# Patient Record
Sex: Female | Born: 1935 | Race: White | Hispanic: No | Marital: Married | Smoking: Former smoker
Health system: Southern US, Community
[De-identification: ages and names within clinical notes are randomized; demographics above are authoritative.]

## PROBLEM LIST (undated history)

## (undated) DIAGNOSIS — E079 Disorder of thyroid, unspecified: Secondary | ICD-10-CM

## (undated) DIAGNOSIS — Z8673 Personal history of transient ischemic attack (TIA), and cerebral infarction without residual deficits: Secondary | ICD-10-CM

## (undated) DIAGNOSIS — G309 Alzheimer's disease, unspecified: Secondary | ICD-10-CM

## (undated) DIAGNOSIS — F0280 Dementia in other diseases classified elsewhere without behavioral disturbance: Secondary | ICD-10-CM

## (undated) HISTORY — PX: EYE SURGERY: SHX253

---

## 2001-06-04 ENCOUNTER — Ambulatory Visit (HOSPITAL_COMMUNITY): Admission: RE | Admit: 2001-06-04 | Discharge: 2001-06-04 | Payer: Self-pay | Admitting: Gastroenterology

## 2001-06-19 ENCOUNTER — Ambulatory Visit (HOSPITAL_COMMUNITY): Admission: RE | Admit: 2001-06-19 | Discharge: 2001-06-19 | Payer: Self-pay | Admitting: Gastroenterology

## 2001-06-19 ENCOUNTER — Encounter (INDEPENDENT_AMBULATORY_CARE_PROVIDER_SITE_OTHER): Payer: Self-pay | Admitting: Specialist

## 2004-12-29 ENCOUNTER — Encounter: Admission: RE | Admit: 2004-12-29 | Discharge: 2004-12-29 | Payer: Self-pay | Admitting: Endocrinology

## 2008-01-21 ENCOUNTER — Encounter: Admission: RE | Admit: 2008-01-21 | Discharge: 2008-01-21 | Payer: Self-pay | Admitting: Endocrinology

## 2009-05-03 ENCOUNTER — Encounter: Admission: RE | Admit: 2009-05-03 | Discharge: 2009-05-03 | Payer: Self-pay | Admitting: Endocrinology

## 2009-12-29 ENCOUNTER — Encounter: Admission: RE | Admit: 2009-12-29 | Discharge: 2009-12-29 | Payer: Self-pay | Admitting: Endocrinology

## 2010-05-18 ENCOUNTER — Other Ambulatory Visit: Payer: Self-pay | Admitting: Endocrinology

## 2010-05-18 DIAGNOSIS — Z1231 Encounter for screening mammogram for malignant neoplasm of breast: Secondary | ICD-10-CM

## 2010-05-22 ENCOUNTER — Ambulatory Visit
Admission: RE | Admit: 2010-05-22 | Discharge: 2010-05-22 | Disposition: A | Discharge status: Home / Self Care | Payer: Medicare Other | Source: Ambulatory Visit | Attending: Endocrinology | Admitting: Endocrinology

## 2010-05-22 DIAGNOSIS — Z1231 Encounter for screening mammogram for malignant neoplasm of breast: Secondary | ICD-10-CM

## 2011-09-27 ENCOUNTER — Ambulatory Visit: Payer: Self-pay

## 2014-02-06 ENCOUNTER — Ambulatory Visit: Payer: Self-pay | Admitting: Internal Medicine

## 2014-02-23 ENCOUNTER — Encounter: Payer: Self-pay | Admitting: Internal Medicine

## 2014-03-09 ENCOUNTER — Encounter: Payer: Self-pay | Admitting: Internal Medicine

## 2014-04-09 ENCOUNTER — Encounter: Payer: Self-pay | Admitting: Internal Medicine

## 2014-05-10 ENCOUNTER — Encounter: Payer: Self-pay | Admitting: Internal Medicine

## 2014-06-08 ENCOUNTER — Encounter
Admit: 2014-06-08 | Disposition: A | Discharge status: Home / Self Care | Payer: Self-pay | Attending: Internal Medicine | Admitting: Internal Medicine

## 2015-02-28 ENCOUNTER — Encounter: Payer: Self-pay | Admitting: Emergency Medicine

## 2015-02-28 ENCOUNTER — Emergency Department: Payer: Medicare Other

## 2015-02-28 ENCOUNTER — Emergency Department
Admission: EM | Admit: 2015-02-28 | Discharge: 2015-02-28 | Disposition: A | Discharge status: Home / Self Care | Payer: Medicare Other | Attending: Emergency Medicine | Admitting: Emergency Medicine

## 2015-02-28 DIAGNOSIS — R0602 Shortness of breath: Secondary | ICD-10-CM | POA: Insufficient documentation

## 2015-02-28 DIAGNOSIS — W1789XA Other fall from one level to another, initial encounter: Secondary | ICD-10-CM | POA: Diagnosis not present

## 2015-02-28 DIAGNOSIS — S0081XA Abrasion of other part of head, initial encounter: Secondary | ICD-10-CM | POA: Insufficient documentation

## 2015-02-28 DIAGNOSIS — N390 Urinary tract infection, site not specified: Secondary | ICD-10-CM | POA: Insufficient documentation

## 2015-02-28 DIAGNOSIS — Y998 Other external cause status: Secondary | ICD-10-CM | POA: Insufficient documentation

## 2015-02-28 DIAGNOSIS — R410 Disorientation, unspecified: Secondary | ICD-10-CM | POA: Diagnosis not present

## 2015-02-28 DIAGNOSIS — Y9289 Other specified places as the place of occurrence of the external cause: Secondary | ICD-10-CM | POA: Diagnosis not present

## 2015-02-28 DIAGNOSIS — T148XXA Other injury of unspecified body region, initial encounter: Secondary | ICD-10-CM

## 2015-02-28 DIAGNOSIS — S0990XA Unspecified injury of head, initial encounter: Secondary | ICD-10-CM | POA: Diagnosis present

## 2015-02-28 DIAGNOSIS — Z87891 Personal history of nicotine dependence: Secondary | ICD-10-CM | POA: Diagnosis not present

## 2015-02-28 DIAGNOSIS — W19XXXA Unspecified fall, initial encounter: Secondary | ICD-10-CM

## 2015-02-28 DIAGNOSIS — R531 Weakness: Secondary | ICD-10-CM | POA: Diagnosis not present

## 2015-02-28 DIAGNOSIS — Y9389 Activity, other specified: Secondary | ICD-10-CM | POA: Diagnosis not present

## 2015-02-28 HISTORY — DX: Alzheimer's disease, unspecified: G30.9

## 2015-02-28 HISTORY — DX: Personal history of transient ischemic attack (TIA), and cerebral infarction without residual deficits: Z86.73

## 2015-02-28 HISTORY — DX: Disorder of thyroid, unspecified: E07.9

## 2015-02-28 HISTORY — DX: Dementia in other diseases classified elsewhere without behavioral disturbance: F02.80

## 2015-02-28 LAB — URINALYSIS COMPLETE WITH MICROSCOPIC (ARMC ONLY)
BILIRUBIN URINE: NEGATIVE
Glucose, UA: NEGATIVE mg/dL
KETONES UR: NEGATIVE mg/dL
NITRITE: POSITIVE — AB
PH: 5 (ref 5.0–8.0)
Protein, ur: NEGATIVE mg/dL
Specific Gravity, Urine: 1.021 (ref 1.005–1.030)
Squamous Epithelial / LPF: NONE SEEN

## 2015-02-28 LAB — COMPREHENSIVE METABOLIC PANEL
ALT: 17 U/L (ref 14–54)
AST: 20 U/L (ref 15–41)
Albumin: 4.5 g/dL (ref 3.5–5.0)
Alkaline Phosphatase: 76 U/L (ref 38–126)
Anion gap: 8 (ref 5–15)
BUN: 17 mg/dL (ref 6–20)
CHLORIDE: 99 mmol/L — AB (ref 101–111)
CO2: 24 mmol/L (ref 22–32)
CREATININE: 0.84 mg/dL (ref 0.44–1.00)
Calcium: 8.9 mg/dL (ref 8.9–10.3)
GFR calc Af Amer: >60 mL/min (ref >60)
Glucose, Bld: 115 mg/dL — ABNORMAL HIGH (ref 65–99)
POTASSIUM: 3.7 mmol/L (ref 3.5–5.1)
SODIUM: 131 mmol/L — AB (ref 135–145)
Total Bilirubin: 0.9 mg/dL (ref 0.3–1.2)
Total Protein: 8.7 g/dL — ABNORMAL HIGH (ref 6.5–8.1)

## 2015-02-28 LAB — DIFFERENTIAL
BASOS ABS: 0 10*3/uL (ref 0–0.1)
BASOS PCT: 1 %
EOS ABS: 0 10*3/uL (ref 0–0.7)
Eosinophils Relative: 0 %
Lymphocytes Relative: 16 %
Lymphs Abs: 0.9 10*3/uL — ABNORMAL LOW (ref 1.0–3.6)
Monocytes Absolute: 0.5 10*3/uL (ref 0.2–0.9)
Monocytes Relative: 8 %
NEUTROS ABS: 4.6 10*3/uL (ref 1.4–6.5)
NEUTROS PCT: 75 %

## 2015-02-28 LAB — CBC
HCT: 44.8 % (ref 35.0–47.0)
Hemoglobin: 15.3 g/dL (ref 12.0–16.0)
MCH: 30.6 pg (ref 26.0–34.0)
MCHC: 34 g/dL (ref 32.0–36.0)
MCV: 89.8 fL (ref 80.0–100.0)
PLATELETS: 177 10*3/uL (ref 150–440)
RBC: 4.99 MIL/uL (ref 3.80–5.20)
RDW: 13.6 % (ref 11.5–14.5)
WBC: 6.1 10*3/uL (ref 3.6–11.0)

## 2015-02-28 LAB — PROTIME-INR
INR: 1.05
PROTHROMBIN TIME: 13.9 s (ref 11.4–15.0)

## 2015-02-28 LAB — TROPONIN I: Troponin I: <0.03 ng/mL (ref <0.031)

## 2015-02-28 LAB — APTT: APTT: 26 s (ref 24–36)

## 2015-02-28 MED ORDER — CEPHALEXIN 500 MG PO CAPS: 500.0000 mg | ORAL_CAPSULE | Freq: Three times a day (TID) | ORAL | Status: AC

## 2015-02-28 MED ORDER — DEXTROSE 5 % IV SOLN
1.0000 g | INTRAVENOUS | Status: DC
  Administered 2015-02-28: 1 g via INTRAVENOUS
  Filled 2015-02-28: qty 10

## 2015-02-28 NOTE — ED Provider Notes (Addendum)
[facility] Emergency Department Provider Note  ____________________________________________  Time seen: Approximately 635 PM  I have reviewed the triage vital signs and the nursing notes.   HISTORY  Chief Complaint Fall; Weakness; and Shortness of Breath  history largely given by the husband as well as the daughter. The patient does not remember the details of the falls.   HPI Jocelyn Gilbert is a 80 y.o. female with a history of Alzheimer's dementia who is presenting today with altered mental status and 2 falls. The falls were both unwitnessed. The patient does not remember the circumstances. She does have a very small abrasion to her forehead. She is not complaining of any pain at this time. She is able to walk with assistance after the falls. The first fall was likely from standing at 2 AM last night. The second fall was off a bar stool this afternoon. The family said that they may have seen some bruising on the right thigh as well. They stated the patient has been increasingly confused over the past 1-2 days. She usually also does not need any assistance to walk.   Past Medical History  Diagnosis Date  . History of TIAs   . Alzheimer disease   . Thyroid disease     There are no active problems to display for this patient.   Past Surgical History  Procedure Laterality Date  . Eye surgery      No current outpatient prescriptions on file.  Allergies Review of patient's allergies indicates no known allergies.  No family history on file.  Social History Social History  Substance Use Topics  . Smoking status: Former Smoker    Types: Cigarettes  . Smokeless tobacco: None  . Alcohol Use: Yes     Comment: occas    Review of Systems Constitutional: No fever/chills Eyes: No visual changes. ENT: No sore throat. Cardiovascular: Denies chest pain. Respiratory: Denies shortness of breath. Gastrointestinal: No abdominal pain.  No nausea, no  vomiting.  No diarrhea.  No constipation. Genitourinary: Negative for dysuria. Musculoskeletal: Negative for back pain. Skin: Negative for rash. Neurological: Negative for headaches, focal weakness or numbness.  10-point ROS otherwise negative.  ____________________________________________   PHYSICAL EXAM:  VITAL SIGNS: ED Triage Vitals  Enc Vitals Group     BP 02/28/15 1711 120/86 mmHg     Pulse Rate 02/28/15 1714 88     Resp 02/28/15 1711 18     Temp 02/28/15 1711 98.1 F (36.7 C)     Temp Source 02/28/15 1711 Oral     SpO2 02/28/15 1714 98 %     Weight 02/28/15 1711 161 lb (73.029 kg)     Height 02/28/15 1711  (1.626 m)     Head Cir --      Peak Flow --      Pain Score --      Pain Loc --      Pain Edu? --      Excl. in GC? --     Constitutional: Alert and oriented to self only which is her baseline. Well appearing and in no acute distress. Eyes: Conjunctivae are normal. PERRL. EOMI. Head: Abrasion to the anterior forearm which is about 1 cm and superficial. No active bleeding. No induration or pus. Nose: No congestion/rhinnorhea. Mouth/Throat: Mucous membranes are moist.  Oropharynx non-erythematous. Neck: No stridor.   Cardiovascular: Normal rate, regular rhythm. Grossly normal heart sounds.  Good peripheral circulation. Respiratory: Normal respiratory effort.  No retractions. Lungs  CTAB. Gastrointestinal: Soft and nontender. No distention. No abdominal bruits. No CVA tenderness. Musculoskeletal: No lower extremity tenderness nor edema.  No joint effusions. No bruising to the bilateral lower extremity. There is no tenderness or crepitus to the chest. No bruising of the abdomen. Spine palpated and no deformity, step-off or tenderness anywhere. Pelvis is stable and nontender. No shortening or rotation of the lower extremities. Patient able to actively lift each lower limb with 5 out of 5 strength. Neurologic:  Normal speech and language. No gross focal neurologic  deficits are appreciated.  Skin:  Skin is warm, dry and intact. No rash noted. Psychiatric: Mood and affect are normal. Speech and behavior are normal.  ____________________________________________   LABS (all labs ordered are listed, but only abnormal results are displayed)  Labs Reviewed  DIFFERENTIAL - Abnormal; Notable for the following:    Lymphs Abs 0.9 (*)    All other components within normal limits  COMPREHENSIVE METABOLIC PANEL - Abnormal; Notable for the following:    Sodium 131 (*)    Chloride 99 (*)    Glucose, Bld 115 (*)    Total Protein 8.7 (*)    All other components within normal limits  URINALYSIS COMPLETEWITH MICROSCOPIC ([facility] ONLY) - Abnormal; Notable for the following:    Color, Urine YELLOW (*)    APPearance HAZY (*)    Hgb urine dipstick 2+ (*)    Nitrite POSITIVE (*)    Leukocytes, UA 1+ (*)    Bacteria, UA MANY (*)    All other components within normal limits  URINE CULTURE  PROTIME-INR  APTT  CBC  TROPONIN I   ____________________________________________  EKG  ED ECG REPORT I, Arelia LongestSchaevitz,  David M, the attending physician, personally viewed and interpreted this ECG.   Date: 02/28/2015  EKG Time: 1803  Rate: 88  Rhythm: normal sinus rhythm  Axis: Left axis deviation.  Intervals:none  ST&T Change: No ST segment elevation or depression. No abnormal T-wave inversion.  ____________________________________________  RADIOLOGY  No acute intracranial abnormality. ____________________________________________   PROCEDURES    ____________________________________________   INITIAL IMPRESSION / ASSESSMENT AND PLAN / ED COURSE  Pertinent labs & imaging results that were available during my care of the patient were reviewed by me and considered in my medical decision making (see chart for details).  ----------------------------------------- 8:57 PM on 02/28/2015 -----------------------------------------  Asian resting comfortably at  this time. Updated the family as well as the patient regarding the patient's positive urinalysis. I also discussed with her the family feels comfortable that they may be able to handle the patient home. I expressed my concern that she may continue to fall if she continues to be confused. I believe that the falls are likely from her confusion and some mild altered mental status from the urinary tract infection. The patient is afebrile with a normal white count this time. The family says that they will be able to care for home and watch her closely. I will give her first dose of antibiotics here in the emergency department through the IV and discharge her with by mouth antibiotics.family understand the plan and are willing to comply. ____________________________________________   FINAL CLINICAL IMPRESSION(S) / ED DIAGNOSES  Altered mental status. Urinary tract infection.    Myrna Blazeravid Matthew Schaevitz, MD 02/28/15 2059  Also discussed return precautions including any worsening confusion or fever. The family understands that she'll return for any worsening or concerning symptoms.  Myrna Blazeravid Matthew Schaevitz, MD 02/28/15 2101

## 2015-02-28 NOTE — ED Notes (Signed)
Pt here with husband and daughter with c/o fall around 0200 this am, husband states he is unsure of why she fell, pt has alz/dementia and is a poor historian/unable to follow most [facility] commands. Pt fell again this afternoon from a barstool. Bruising to forehead and small lac noted, no bleeding at this time. Husband states her urine smells strong. Pt appears in no distress at this time.

## 2015-02-28 NOTE — ED Notes (Signed)
Patient transported to X-ray 

## 2015-02-28 NOTE — ED Notes (Signed)
Pt waiting for med to finish.

## 2015-02-28 NOTE — ED Notes (Signed)
LKW 2000 02/27/15.

## 2015-03-02 LAB — URINE CULTURE

## 2016-08-07 ENCOUNTER — Emergency Department: Payer: Medicare Other

## 2016-08-07 ENCOUNTER — Encounter: Payer: Self-pay | Admitting: Emergency Medicine

## 2016-08-07 ENCOUNTER — Observation Stay
Admission: EM | Admit: 2016-08-07 | Discharge: 2016-08-08 | Disposition: A | Discharge status: Home / Self Care | Payer: Medicare Other | Attending: Internal Medicine | Admitting: Internal Medicine

## 2016-08-07 DIAGNOSIS — Z79899 Other long term (current) drug therapy: Secondary | ICD-10-CM | POA: Diagnosis not present

## 2016-08-07 DIAGNOSIS — G309 Alzheimer's disease, unspecified: Secondary | ICD-10-CM | POA: Diagnosis not present

## 2016-08-07 DIAGNOSIS — R29810 Facial weakness: Secondary | ICD-10-CM | POA: Diagnosis not present

## 2016-08-07 DIAGNOSIS — Z87891 Personal history of nicotine dependence: Secondary | ICD-10-CM | POA: Diagnosis not present

## 2016-08-07 DIAGNOSIS — I639 Cerebral infarction, unspecified: Secondary | ICD-10-CM

## 2016-08-07 DIAGNOSIS — F0280 Dementia in other diseases classified elsewhere without behavioral disturbance: Secondary | ICD-10-CM | POA: Insufficient documentation

## 2016-08-07 DIAGNOSIS — G459 Transient cerebral ischemic attack, unspecified: Secondary | ICD-10-CM | POA: Diagnosis present

## 2016-08-07 DIAGNOSIS — Z7982 Long term (current) use of aspirin: Secondary | ICD-10-CM | POA: Diagnosis not present

## 2016-08-07 DIAGNOSIS — R531 Weakness: Secondary | ICD-10-CM | POA: Insufficient documentation

## 2016-08-07 DIAGNOSIS — E785 Hyperlipidemia, unspecified: Secondary | ICD-10-CM | POA: Diagnosis not present

## 2016-08-07 DIAGNOSIS — R350 Frequency of micturition: Secondary | ICD-10-CM | POA: Insufficient documentation

## 2016-08-07 DIAGNOSIS — Z8673 Personal history of transient ischemic attack (TIA), and cerebral infarction without residual deficits: Secondary | ICD-10-CM | POA: Insufficient documentation

## 2016-08-07 DIAGNOSIS — E079 Disorder of thyroid, unspecified: Secondary | ICD-10-CM | POA: Diagnosis not present

## 2016-08-07 LAB — COMPREHENSIVE METABOLIC PANEL
ALT: 15 U/L (ref 14–54)
ANION GAP: 8 (ref 5–15)
AST: 23 U/L (ref 15–41)
Albumin: 4 g/dL (ref 3.5–5.0)
Alkaline Phosphatase: 77 U/L (ref 38–126)
BUN: 8 mg/dL (ref 6–20)
CHLORIDE: 105 mmol/L (ref 101–111)
CO2: 25 mmol/L (ref 22–32)
Calcium: 9.2 mg/dL (ref 8.9–10.3)
Creatinine, Ser: 0.67 mg/dL (ref 0.44–1.00)
GFR calc non Af Amer: >60 mL/min (ref >60)
Glucose, Bld: 97 mg/dL (ref 65–99)
POTASSIUM: 3.6 mmol/L (ref 3.5–5.1)
SODIUM: 138 mmol/L (ref 135–145)
Total Bilirubin: 1.2 mg/dL (ref 0.3–1.2)
Total Protein: 7.2 g/dL (ref 6.5–8.1)

## 2016-08-07 LAB — CBC WITH DIFFERENTIAL/PLATELET
Basophils Absolute: 0 10*3/uL (ref 0–0.1)
Basophils Relative: 0 %
EOS ABS: 0.2 10*3/uL (ref 0–0.7)
EOS PCT: 3 %
HCT: 40.3 % (ref 35.0–47.0)
Hemoglobin: 13.7 g/dL (ref 12.0–16.0)
LYMPHS ABS: 1.5 10*3/uL (ref 1.0–3.6)
Lymphocytes Relative: 21 %
MCH: 30.7 pg (ref 26.0–34.0)
MCHC: 33.9 g/dL (ref 32.0–36.0)
MCV: 90.5 fL (ref 80.0–100.0)
MONO ABS: 0.8 10*3/uL (ref 0.2–0.9)
Monocytes Relative: 11 %
Neutro Abs: 4.6 10*3/uL (ref 1.4–6.5)
Neutrophils Relative %: 65 %
PLATELETS: 183 10*3/uL (ref 150–440)
RBC: 4.45 MIL/uL (ref 3.80–5.20)
RDW: 13 % (ref 11.5–14.5)
WBC: 7 10*3/uL (ref 3.6–11.0)

## 2016-08-07 LAB — URINALYSIS, COMPLETE (UACMP) WITH MICROSCOPIC
BILIRUBIN URINE: NEGATIVE
Bacteria, UA: NONE SEEN
Glucose, UA: NEGATIVE mg/dL
HGB URINE DIPSTICK: NEGATIVE
Ketones, ur: NEGATIVE mg/dL
Leukocytes, UA: NEGATIVE
NITRITE: NEGATIVE
PROTEIN: NEGATIVE mg/dL
Specific Gravity, Urine: 1.017 (ref 1.005–1.030)
Squamous Epithelial / LPF: NONE SEEN
pH: 5 (ref 5.0–8.0)

## 2016-08-07 LAB — TROPONIN I

## 2016-08-07 LAB — LIPASE, BLOOD: LIPASE: 24 U/L (ref 11–51)

## 2016-08-07 LAB — TSH: TSH: 0.191 u[IU]/mL — AB (ref 0.350–4.500)

## 2016-08-07 MED ORDER — ACETAMINOPHEN 325 MG PO TABS
650.0000 mg | ORAL_TABLET | Freq: Four times a day (QID) | ORAL | Status: DC | PRN
  Administered 2016-08-07: 21:00:00 650 mg via ORAL
  Filled 2016-08-07: qty 2

## 2016-08-07 MED ORDER — SODIUM CHLORIDE 0.9% FLUSH
3.0000 mL | Freq: Two times a day (BID) | INTRAVENOUS | Status: DC
  Administered 2016-08-07 – 2016-08-08 (×2): 3 mL via INTRAVENOUS

## 2016-08-07 MED ORDER — ACETAMINOPHEN 650 MG RE SUPP: 650.0000 mg | Freq: Four times a day (QID) | RECTAL | Status: DC | PRN

## 2016-08-07 MED ORDER — POLYETHYLENE GLYCOL 3350 17 G PO PACK: 17.0000 g | PACK | Freq: Every day | ORAL | Status: DC | PRN

## 2016-08-07 MED ORDER — ONDANSETRON HCL 4 MG/2ML IJ SOLN: 4.0000 mg | Freq: Four times a day (QID) | INTRAMUSCULAR | Status: DC | PRN

## 2016-08-07 MED ORDER — LEVOTHYROXINE SODIUM 112 MCG PO TABS
137.0000 ug | ORAL_TABLET | Freq: Every day | ORAL | Status: DC
  Administered 2016-08-08: 137 ug via ORAL
  Filled 2016-08-07: qty 1

## 2016-08-07 MED ORDER — SODIUM CHLORIDE 0.9% FLUSH
3.0000 mL | Freq: Two times a day (BID) | INTRAVENOUS | Status: DC
  Administered 2016-08-07 – 2016-08-08 (×2): 3 mL via INTRAVENOUS

## 2016-08-07 MED ORDER — ASPIRIN EC 81 MG PO TBEC: 81.0000 mg | DELAYED_RELEASE_TABLET | Freq: Every day | ORAL | Status: DC

## 2016-08-07 MED ORDER — BRIMONIDINE TARTRATE 0.2 % OP SOLN
1.0000 [drp] | Freq: Two times a day (BID) | OPHTHALMIC | Status: DC
  Administered 2016-08-07: 1 [drp] via OPHTHALMIC
  Filled 2016-08-07: qty 5

## 2016-08-07 MED ORDER — LATANOPROST 0.005 % OP SOLN
1.0000 [drp] | Freq: Every day | OPHTHALMIC | Status: DC
  Administered 2016-08-07: 1 [drp] via OPHTHALMIC
  Filled 2016-08-07: qty 2.5

## 2016-08-07 MED ORDER — SODIUM CHLORIDE 0.9% FLUSH
3.0000 mL | INTRAVENOUS | Status: DC | PRN
Start: 2016-08-07 — End: 2016-08-08

## 2016-08-07 MED ORDER — ENOXAPARIN SODIUM 40 MG/0.4ML ~~LOC~~ SOLN
40.0000 mg | SUBCUTANEOUS | Status: DC
  Administered 2016-08-07: 21:00:00 40 mg via SUBCUTANEOUS
  Filled 2016-08-07: qty 0.4

## 2016-08-07 MED ORDER — SODIUM CHLORIDE 0.9 % IV SOLN: 250.0000 mL | INTRAVENOUS | Status: DC | PRN

## 2016-08-07 MED ORDER — TIMOLOL MALEATE 0.5 % OP SOLN
1.0000 [drp] | Freq: Two times a day (BID) | OPHTHALMIC | Status: DC
  Administered 2016-08-07: 21:00:00 1 [drp] via OPHTHALMIC
  Filled 2016-08-07: qty 5

## 2016-08-07 MED ORDER — MEMANTINE HCL ER 28 MG PO CP24
28.0000 mg | ORAL_CAPSULE | Freq: Every day | ORAL | Status: DC
  Administered 2016-08-08: 28 mg via ORAL
  Filled 2016-08-07: qty 1

## 2016-08-07 MED ORDER — ONDANSETRON HCL 4 MG PO TABS: 4.0000 mg | ORAL_TABLET | Freq: Four times a day (QID) | ORAL | Status: DC | PRN

## 2016-08-07 MED ORDER — BRIMONIDINE TARTRATE-TIMOLOL 0.2-0.5 % OP SOLN: 1.0000 [drp] | Freq: Two times a day (BID) | OPHTHALMIC | Status: DC

## 2016-08-07 MED ORDER — ROSUVASTATIN CALCIUM 5 MG PO TABS
10.0000 mg | ORAL_TABLET | Freq: Every day | ORAL | Status: DC
  Administered 2016-08-08: 12:00:00 10 mg via ORAL
  Filled 2016-08-07: qty 2

## 2016-08-07 NOTE — H&P (Signed)
[employer] - [place] [facility]   PATIENT NAME: Jocelyn Gilbert    MR#:  [id number]  DATE OF BIRTH:  1934/07/18  DATE OF ADMISSION:  08/07/2016  PRIMARY CARE PHYSICIAN: Lynnea Ferrier, MD   REQUESTING/REFERRING PHYSICIAN: Dr. Raynelle Chary  CHIEF COMPLAINT:   Chief Complaint  Patient presents with  . Urinary Frequency    HISTORY OF PRESENT ILLNESS:  Jocelyn Gilbert  is a 82 y.o. female with a known history of Hyperlipidemia, Alzheimer's, Graves' disease presents to the hospital brought in by the husband due to weakness. Also noticed a left facial droop which lasted an hour. Presently patient is back to baseline. No history of stroke. Patient was doing well till yesterday night. Later was noticed to be weak. Presently she is had her CT scan of the head which was normal. Being admitted for workup of TIA. She is pleasantly confused. History obtained from husband at bedside and ER staff.  PAST MEDICAL HISTORY:   Past Medical History:  Diagnosis Date  . Alzheimer disease   . History of TIAs   . Thyroid disease     PAST SURGICAL HISTORY:   Past Surgical History:  Procedure Laterality Date  . EYE SURGERY      SOCIAL HISTORY:   Social History  Substance Use Topics  . Smoking status: Former Smoker    Types: Cigarettes  . Smokeless tobacco: Never Used  . Alcohol use Yes     Comment: occas    FAMILY HISTORY:  History reviewed. No pertinent family history. Multiple TIAs in her father DRUG ALLERGIES:  No Known Allergies  REVIEW OF SYSTEMS:   Review of Systems  Unable to perform ROS: Dementia    MEDICATIONS AT HOME:   Prior to Admission medications   Medication Sig Start Date End Date Taking? Authorizing Provider  aspirin EC 81 MG tablet Take 81 mg by mouth daily.   Yes Historical Provider, MD  calcium carbonate (CALCIUM 600) 600 MG TABS tablet Take 1 tablet by mouth daily.   Yes Historical Provider, MD  COMBIGAN 0.2-0.5 % ophthalmic solution  Place 1 drop into both eyes 2 (two) times daily.  06/26/16  Yes Historical Provider, MD  Cyanocobalamin (B-12) 2500 MCG TABS Take 2,500 mcg by mouth daily.   Yes Historical Provider, MD  LACTOBACILLUS RHAMNOSUS, GG, PO Take 1 tablet by mouth daily.   Yes Historical Provider, MD  latanoprost (XALATAN) 0.005 % ophthalmic solution Place 1 drop into both eyes at bedtime. 07/13/16  Yes Historical Provider, MD  NAMENDA XR 28 MG CP24 24 hr capsule Take 28 mg by mouth daily. 06/19/16  Yes Historical Provider, MD  rivastigmine (EXELON) 9.5 mg/24hr Place 1 patch onto the skin daily. 06/26/16  Yes Historical Provider, MD  rosuvastatin (CRESTOR) 10 MG tablet Take 10 mg by mouth daily.   Yes Historical Provider, MD  SYNTHROID 137 MCG tablet Take 137 mcg by mouth daily. 06/27/16  Yes Historical Provider, MD     VITAL SIGNS:  Blood pressure 123/61, pulse 81, temperature 97.8 F (36.6 C), temperature source Oral, resp. rate 18, weight 73 kg (161 lb), SpO2 90 %.  PHYSICAL EXAMINATION:  Physical Exam  GENERAL:  82 y.o.-year-old patient lying in the bed with no acute distress.  EYES: Pupils equal, round, reactive to light and accommodation. No scleral icterus. Extraocular muscles intact.  HEENT: Head atraumatic, normocephalic. Oropharynx and nasopharynx clear. No oropharyngeal erythema, moist oral mucosa  NECK:  Supple, no jugular venous distention. No thyroid  enlargement, no tenderness.  LUNGS: Normal breath sounds bilaterally, no wheezing, rales, rhonchi. No use of accessory muscles of respiration.  CARDIOVASCULAR: S1, S2 normal. No murmurs, rubs, or gallops.  ABDOMEN: Soft, nontender, nondistended. Bowel sounds present. No organomegaly or mass.  EXTREMITIES: No pedal edema, cyanosis, or clubbing. + 2 pedal & radial pulses b/l.   NEUROLOGIC: Cranial nerves II through XII are intact. No focal Motor or sensory deficits appreciated b/l PSYCHIATRIC: The patient is alert and Awake. Pleasantly confused. SKIN: No  obvious rash, lesion, or ulcer.   LABORATORY PANEL:   CBC  Recent Labs Lab 08/07/16 1506  WBC 7.0  HGB 13.7  HCT 40.3  PLT 183   ------------------------------------------------------------------------------------------------------------------  Chemistries   Recent Labs Lab 08/07/16 1558  NA 138  K 3.6  CL 105  CO2 25  GLUCOSE 97  BUN 8  CREATININE 0.67  CALCIUM 9.2  AST 23  ALT 15  ALKPHOS 77  BILITOT 1.2   ------------------------------------------------------------------------------------------------------------------  Cardiac Enzymes  Recent Labs Lab 08/07/16 1558  TROPONINI <0.03   ------------------------------------------------------------------------------------------------------------------  RADIOLOGY:  Dg Chest 1 View  Result Date: 08/07/2016 CLINICAL DATA:  Altered mental status. Weakness. Headache. Possible urinary tract infection. EXAM: CHEST 1 VIEW COMPARISON:  02/28/2015 FINDINGS: Heart size is normal. Aortic atherosclerosis. The lungs are clear. The vascularity is normal. No effusions. No significant bone finding. IMPRESSION: No active disease. Electronically Signed   By: Paulina Fusi M.D.   On: 08/07/2016 15:34   Ct Head Wo Contrast  Result Date: 08/07/2016 CLINICAL DATA:  Headache and altered mental status. History of Alzheimer's dementia EXAM: CT HEAD WITHOUT CONTRAST TECHNIQUE: Contiguous axial images were obtained from the base of the skull through the vertex without intravenous contrast. COMPARISON:  February 28, 2015 FINDINGS: Brain: There is extensive generalized atrophy, stable. There is no intracranial mass, hemorrhage, extra-axial fluid collection, or midline shift. There is small vessel disease throughout the centra semiovale bilaterally. There is small vessel disease throughout the anterior limbs of each external capsule, stable. Small vessel disease in each lateral thalamus is present. There is no appreciable acute infarct. Vascular:  There is no hyperdense vessel. Mild increased attenuation and both middle cerebral arteries is stable compared to the prior study. There is calcification in each carotid siphon region. Skull: The bony calvarium appears intact. Sinuses/Orbits: There is diffuse opacification of most of the ethmoid air cells bilaterally. There is mucosal thickening in each frontal sinus inferiorly. There is mild mucosal thickening in each maxillary antrum. No air-fluid levels. There is rightward deviation of the nasal septum. Orbits appear symmetric bilaterally. Other: There is chronic inferior left mastoid air cell disease. Mastoids elsewhere clear. IMPRESSION: Extensive atrophy with supratentorial small vessel disease, stable. No intracranial mass, hemorrhage, or extra-axial fluid collection. No acute appearing infarct. There are foci of arteriovascular calcification. There is extensive paranasal sinus disease, most marked in the ethmoid air cell regions as well as chronic inferior left mastoid disease. Electronically Signed   By: Bretta Bang III M.D.   On: 08/07/2016 15:28     IMPRESSION AND PLAN:   * TIA -Check MRI of the brain, Carotid dopplers, Echo - Start aspirin and statin. - Lovenox for DVT prophylaxis. - PT/OT/Speech consult as needed per symptoms - Consult neurology.  * Dementia. Watch for inpatient delirium.  * Hyperlipidemia. On Crestor.  * DVT prophylaxis with Lovenox  All the records are reviewed and case discussed with ED provider. Management plans discussed with the patient, family and they are in  agreement.  CODE STATUS: FULL CODE  TOTAL TIME TAKING CARE OF THIS PATIENT: 40 minutes.   Milagros Loll R M.D on 08/07/2016 at 6:38 PM  Between 7am to 6pm - Pager - [phone]  After 6pm go to [email or web address] - password [email or web address] [facility]  [facility]  Office  [phone]  CC: Primary care physician; Lynnea Ferrier, MD  Note: This dictation was prepared with Dragon  dictation along with smaller phrase technology. Any transcriptional errors that result from this process are unintentional.

## 2016-08-07 NOTE — ED Provider Notes (Signed)
[facility] Emergency Department Provider Note  ____________________________________________   First MD Initiated Contact with Patient 08/07/16 1434     (approximate)  I have reviewed the triage vital signs and the nursing notes.   HISTORY  Chief Complaint Urinary Frequency   HPI Jocelyn Gilbert is a 82 y.o. female with a history of dementia, TIAs and thyroid disease was present to the emergency department today with altered mental status. She is accompanied by her husband who says the last time that she was acting normally was 9 PM last night before going to bed. He said that she woke up at about 2 in the morning and had an episode of urinary and bowel incontinence. She then went back to bed and then at 7 AM woke up. The husband says that at 7 AM that he tried to feed her breakfast and she only minimal amount and was unable to walk. He said there was also. Time for about an hour he noticed left-sided facial droop which is now resolved. He says that she is usually able to converse but is increasingly confused at this time and is unable to recognize him which is worse than her normal baseline. He is concerned mostly for UTI or stroke.Husband reported the patient was clinically headaches before. However at this time the patient is not complaining of pain to her head.   Past Medical History:  Diagnosis Date  . Alzheimer disease   . History of TIAs   . Thyroid disease     There are no active problems to display for this patient.   Past Surgical History:  Procedure Laterality Date  . EYE SURGERY      Prior to Admission medications   Not on File    Allergies Patient has no known allergies.  History reviewed. No pertinent family history.  Social History Social History  Substance Use Topics  . Smoking status: Former Smoker    Types: Cigarettes  . Smokeless tobacco: Never Used  . Alcohol use Yes     Comment: occas    Review of  Systems  Level V caveat secondary to altered mental status.   ____________________________________________   PHYSICAL EXAM:  VITAL SIGNS: ED Triage Vitals  Enc Vitals Group     BP 08/07/16 1423 (!) 124/57     Pulse Rate 08/07/16 1423 93     Resp 08/07/16 1423 18     Temp 08/07/16 1423 97.8 F (36.6 C)     Temp Source 08/07/16 1423 Oral     SpO2 08/07/16 1423 95 %     Weight 08/07/16 1425 161 lb (73 kg)     Height --      Head Circumference --      Peak Flow --      Pain Score --      Pain Loc --      Pain Edu? --      Excl. in GC? --     Constitutional: Alert. in no acute distress.Is unable to give her name. However, is answering yes or no questions to some questions about the history and where she is having pain. Eyes: Conjunctivae are normal. PERRL. EOMI. Head: Atraumatic. Nose: No congestion/rhinnorhea. Mouth/Throat: Mucous membranes are moist.  Oropharynx non-erythematous. Neck: No stridor.   Cardiovascular: Normal rate, regular rhythm. Grossly normal heart sounds.   Respiratory: Normal respiratory effort.  No retractions. Lungs CTAB. Gastrointestinal: Soft and nontender. No distention.  Musculoskeletal: No lower extremity tenderness nor edema.  No joint effusions. Neurologic:  Normal speech and language. No gross focal neurologic deficits are appreciated.  Skin:  Skin is warm, dry and intact. No rash noted. Psychiatric: Mood and affect are normal. Speech and behavior are normal.  NIH Stroke Scale   Person Administering Scale: Arelia Longest  Administer stroke scale items in the order listed. Record performance in each category after each subscale exam. Do not go back and change scores. Follow directions provided for each exam technique. Scores should reflect what the patient does, not what the clinician thinks the patient can do. The clinician should record answers while administering the exam and work quickly. Except where indicated, the patient should not  be coached (i.e., repeated requests to patient to make a special effort).   1a  Level of consciousness: 1  1b. LOC questions:  0=Performs both tasks correctly  1c. LOC commands: 0=Performs both tasks correctly  2.  Best Gaze: 0=normal  3.  Visual: 0=No visual loss  4. Facial Palsy: 0=Normal symmetric movement  5a.  Motor left arm: 0=No drift, limb holds 90 (or 45) degrees for full 10 seconds  5b.  Motor right arm: 0=No drift, limb holds 90 (or 45) degrees for full 10 seconds  6a. motor left leg: 0=No drift, limb holds 90 (or 45) degrees for full 10 seconds  6b  Motor right leg:  0=No drift, limb holds 90 (or 45) degrees for full 10 seconds  7. Limb Ataxia: 0=Absent  8.  Sensory: 0=Normal; no sensory loss  9. Best Language:  0=No aphasia, normal  10. Dysarthria: 0=Normal  11. Extinction and Inattention: 0=No abnormality  12. Distal motor function: 0=Normal   Total:   1   ____________________________________________   LABS (all labs ordered are listed, but only abnormal results are displayed)  Labs Reviewed  URINALYSIS, COMPLETE (UACMP) WITH MICROSCOPIC - Abnormal; Notable for the following:       Result Value   Color, Urine YELLOW (*)    APPearance CLEAR (*)    All other components within normal limits  TSH - Abnormal; Notable for the following:    TSH 0.191 (*)    All other components within normal limits  CBC WITH DIFFERENTIAL/PLATELET  COMPREHENSIVE METABOLIC PANEL  LIPASE, BLOOD  TROPONIN I   ____________________________________________  EKG  ED ECG REPORT I, Arelia Longest, the attending physician, personally viewed and interpreted this ECG.   Date: 08/07/2016  EKG Time: 1505  Rate: 75  Rhythm: normal sinus rhythm  Axis: Normal  Intervals:none  ST&T Change: No ST segment elevation or depression. No abnormal T-wave inversion.  ____________________________________________  RADIOLOGY  DG Chest 1 View (Final result)  Result time 08/07/16 15:34:30   Final result by Paulina Fusi, MD (08/07/16 15:34:30)           Narrative:   CLINICAL DATA: Altered mental status. Weakness. Headache. Possible urinary tract infection.  EXAM: CHEST 1 VIEW  COMPARISON: 02/28/2015  FINDINGS: Heart size is normal. Aortic atherosclerosis. The lungs are clear. The vascularity is normal. No effusions. No significant bone finding.  IMPRESSION: No active disease.   Electronically Signed By: Paulina Fusi M.D. On: 08/07/2016 15:34            CT Head Wo Contrast (Final result)  Result time 08/07/16 15:28:51  Final result by Bretta Bang III, MD (08/07/16 15:28:51)           Narrative:   CLINICAL DATA: Headache and altered mental status. History of Alzheimer's dementia  EXAM: CT HEAD WITHOUT CONTRAST  TECHNIQUE: Contiguous axial images were obtained from the base of the skull through the vertex without intravenous contrast.  COMPARISON: February 28, 2015  FINDINGS: Brain: There is extensive generalized atrophy, stable. There is no intracranial mass, hemorrhage, extra-axial fluid collection, or midline shift. There is small vessel disease throughout the centra semiovale bilaterally. There is small vessel disease throughout the anterior limbs of each external capsule, stable. Small vessel disease in each lateral thalamus is present. There is no appreciable acute infarct.  Vascular: There is no hyperdense vessel. Mild increased attenuation and both middle cerebral arteries is stable compared to the prior study. There is calcification in each carotid siphon region.  Skull: The bony calvarium appears intact.  Sinuses/Orbits: There is diffuse opacification of most of the ethmoid air cells bilaterally. There is mucosal thickening in each frontal sinus inferiorly. There is mild mucosal thickening in each maxillary antrum. No air-fluid levels. There is rightward deviation of the nasal septum. Orbits appear symmetric  bilaterally.  Other: There is chronic inferior left mastoid air cell disease. Mastoids elsewhere clear.  IMPRESSION: Extensive atrophy with supratentorial small vessel disease, stable. No intracranial mass, hemorrhage, or extra-axial fluid collection. No acute appearing infarct. There are foci of arteriovascular calcification. There is extensive paranasal sinus disease, most marked in the ethmoid air cell regions as well as chronic inferior left mastoid disease.   Electronically Signed By: Bretta Bang III M.D. On: 08/07/2016 15:28          ____________________________________________   PROCEDURES  Procedure(s) performed:   Procedures  Critical Care performed:   ____________________________________________   INITIAL IMPRESSION / ASSESSMENT AND PLAN / ED COURSE  Pertinent labs & imaging results that were available during my care of the patient were reviewed by me and considered in my medical decision making (see chart for details).  ----------------------------------------- 5:09 PM on 08/07/2016 -----------------------------------------  Patient without obvious acute finding on her lab workup. Concern for TIA/CVA. Continues to be neuro intact. No facial droop at this time. Patient will be admitted to the hospital. Squamous plan the patient as well as her husband who is the bedside. Signed out to Dr. Elpidio Anis. However, the patient does continue to be slightly confused compared to her baseline. Out of the window for TPA as the last time the patient was seen normal was 9 PM last night.      ____________________________________________   FINAL CLINICAL IMPRESSION(S) / ED DIAGNOSES  CVA    NEW MEDICATIONS STARTED DURING THIS VISIT:  New Prescriptions   No medications on file     Note:  This document was prepared using Dragon voice recognition software and may include unintentional dictation errors.    Myrna Blazer, MD 08/07/16 1710

## 2016-08-07 NOTE — ED Notes (Signed)
Pt given meal tray.

## 2016-08-07 NOTE — ED Triage Notes (Signed)
Pt to ed with c/o altered mental status, foul smelling urine, weakness and c/o headache.  Pt has hx of alzhiemers disease.

## 2016-08-07 NOTE — ED Notes (Signed)
Green top recollect sent 

## 2016-08-07 NOTE — ED Notes (Signed)
Patient transported to CT/XR ?

## 2016-08-08 ENCOUNTER — Observation Stay: Payer: Medicare Other

## 2016-08-08 ENCOUNTER — Observation Stay
Admit: 2016-08-08 | Discharge: 2016-08-08 | Disposition: A | Discharge status: Home / Self Care | Payer: Medicare Other | Attending: Internal Medicine | Admitting: Internal Medicine

## 2016-08-08 DIAGNOSIS — R29810 Facial weakness: Secondary | ICD-10-CM | POA: Diagnosis not present

## 2016-08-08 NOTE — Care Management Obs Status (Signed)
MEDICARE OBSERVATION STATUS NOTIFICATION   Patient Details  Name: Jocelyn Gilbert MRN: [id number] Date of Birth: 1934/07/04   Medicare Observation Status Notification Given:  Yes    Marily Memos, RN 08/08/2016, 4:22 PM

## 2016-08-08 NOTE — Evaluation (Signed)
Clinical/Bedside Swallow Evaluation Patient Details  Name: Jocelyn Gilbert MRN: [id number] Date of Birth: 11/28/1934  Today's Date: 08/08/2016 Time: SLP Start Time (ACUTE ONLY): 1230 SLP Stop Time (ACUTE ONLY): 1320 SLP Time Calculation (min) (ACUTE ONLY): 50 min  Past Medical History:  Past Medical History:  Diagnosis Date  . Alzheimer disease   . History of TIAs   . Thyroid disease    Past Surgical History:  Past Surgical History:  Procedure Laterality Date  . EYE SURGERY     HPI:  Pt is a 82 y.o. female with a known history of hyperlipidemia, Alzheimer's Dementia, Graves' disease, h/o TIAs who presents to the hospital brought in by the husband due to weakness. Also noticed a left facial droop which lasted an hour. Presently patient is back to baseline. Presently she is had her CT scan of the head which was normal. Being admitted for workup of TIA. She is pleasantly confused. History obtained from husband at bedside. Currently this pm, pt frequently laughing and talking w/ others in room w/ word finding deficits noted - pt does have baseline of Alzheimer's Dementia and the laughing may have been an attmept to cover up the word finding deficits. Noted pt had a min hacking, fairly non-productive cough during talking.    Assessment / Plan / Recommendation Clinical Impression  Pt appeared to adequately tolerate trials of thin liquids and puree/soft solids w/ no overt s/s of aspiration noted during/post trials; pt appears at reduced risk for aspiration when following general aspiration precautions. Pt does have a baseline of Alzheimer's Dementia and d/t the degree of Cognitive impact, pt would benefit from support strategies at mealtime which were discussed w/ family members to include reducing distractions and talking. As pt appears to exhibit no gross oropharyngeal phase dysphagia, no further skilled ST services indicated at this time. Of note, education was given to family members/pt on  ways to support language and communication during this stressful time for pt (out of her norm and structure); noted results of MRI this AM indicating no acute findings at this time - advanced chronic small vessel ischemic changes throughout the hemispheric white matter. Results of this eval discussed w/ NSG.  SLP Visit Diagnosis: Dysphagia, unspecified (R13.10)    Aspiration Risk   (reduced )    Diet Recommendation  Regular diet w/ Thin liquids; general aspiration precautions to include reducing distractions during mealtimes  Medication Administration: Whole meds with liquid (can always utilize applesauce for Pill swallowing)    Other  Recommendations Recommended Consults:  (n/a currently) Oral Care Recommendations: Oral care BID;Patient independent with oral care;Staff/trained caregiver to provide oral care   Follow up Recommendations None      Frequency and Duration            Prognosis Prognosis for Safe Diet Advancement: Good Barriers to Reach Goals: Cognitive deficits      Swallow Study   General Date of Onset: 08/07/16 HPI: Pt is a 82 y.o. female with a known history of hyperlipidemia, Alzheimer's Dementia, Graves' disease, h/o TIAs who presents to the hospital brought in by the husband due to weakness. Also noticed a left facial droop which lasted an hour. Presently patient is back to baseline. Presently she is had her CT scan of the head which was normal. Being admitted for workup of TIA. She is pleasantly confused. History obtained from husband at bedside. Currently this pm, pt frequently laughing and talking w/ others in room w/ word finding deficits noted - pt  does have baseline of Alzheimer's Dementia and the laughing may have been an attmept to cover up the word finding deficits. Noted pt had a min hacking, fairly non-productive cough during talking.  Type of Study: Bedside Swallow Evaluation Previous Swallow Assessment: none indicated Diet Prior to this Study:  Regular;Thin liquids (husband monitors meals for pt at home per report) Temperature Spikes Noted: No (wbc 7.0) Respiratory Status: Room air History of Recent Intubation: No Behavior/Cognition: Alert;Cooperative;Pleasant mood;Confused;Requires cueing;Distractible Oral Cavity Assessment: Within Functional Limits Oral Care Completed by SLP: Recent completion by staff Oral Cavity - Dentition: Adequate natural dentition Vision: Functional for self-feeding Self-Feeding Abilities: Able to feed self;Needs assist;Needs set up (cues) Patient Positioning: Upright in bed Baseline Vocal Quality: Normal Volitional Cough: Strong Volitional Swallow: Able to elicit    Oral/Motor/Sensory Function Overall Oral Motor/Sensory Function: Within functional limits   Ice Chips Ice chips: Not tested Other Comments: getting ready to take her pills w/ NSG   Thin Liquid Thin Liquid: Within functional limits Presentation: Cup;Self Fed;Straw (~8 ozs total) Other Comments: education given on straw drinking; use of larger straws    Nectar Thick Nectar Thick Liquid: Not tested   Honey Thick Honey Thick Liquid: Not tested   Puree Puree: Within functional limits Presentation: Self Fed;Spoon (4 ozs)   Solid   GO   Solid: Within functional limits Presentation: Self Fed (2 trials)    Functional Assessment Tool Used: clinical judgement Functional Limitations: Swallowing Swallow Current Status (W0981): At least 1 percent but less than 20 percent impaired, limited or restricted Swallow Goal Status [phone]): At least 1 percent but less than 20 percent impaired, limited or restricted Swallow Discharge Status [phone]): At least 1 percent but less than 20 percent impaired, limited or restricted     Jerilynn Som, MS, CCC-SLP Jaylaa Gallion 08/08/2016,1:38 PM

## 2016-08-08 NOTE — Discharge Summary (Signed)
[facility] - [place] at [facility]   PATIENT NAME: Jocelyn Gilbert    MR#:  [id number]  DATE OF BIRTH:  [birth year]  DATE OF ADMISSION:  08/07/2016 ADMITTING PHYSICIAN: Milagros Loll, MD  DATE OF DISCHARGE: 08/08/16  PRIMARY CARE PHYSICIAN: Curtis Sites III, MD    ADMISSION DIAGNOSIS:  Facial droop [R29.810] CVA (cerebral vascular accident) (HCC) [I63.9] Cerebrovascular accident (CVA), unspecified mechanism (HCC) [I63.9]  DISCHARGE DIAGNOSIS:  TIA (suspected)  SECONDARY DIAGNOSIS:   Past Medical History:  Diagnosis Date  . Alzheimer disease   . History of TIAs   . Thyroid disease     HOSPITAL COURSE:  Jocelyn Gilbert  is a 82 y.o. female with a known history of Hyperlipidemia, Alzheimer's, Graves' disease presents to the hospital brought in by the husband due to weakness. Also noticed a left facial droop which lasted an hour. Presently patient is back to baseline. No history of stroke. Patient was doing well till yesterday night. Later was noticed to be weak   *TIA -MRI of the brain negative , Carotid dopplers no stenosis, Echo pending  - cont aspirin and statin. - Lovenox for DVT prophylaxis. - Spokewith husband and daughter at length. Husband and patient is back at her baseline. She has significant history of Alzheimer's dementia was not able to give me any history of review of systems earlier.or inpatient delirium. -PT will see patient prior to discharge.  * Hyperlipidemia. On Crestor.  * DVT prophylaxis with Lovenox  Were all hemodynamically stable. Discharge home once seen by PT Husband and daughter are agreeable CONSULTS OBTAINED:  Treatment Team:  Mick Sell, MD  DRUG ALLERGIES:  No Known Allergies  DISCHARGE MEDICATIONS:   Current Discharge Medication List    CONTINUE these medications which have NOT CHANGED   Details  aspirin EC 81 MG tablet Take 81 mg by mouth daily.    calcium carbonate (CALCIUM 600) 600 MG  TABS tablet Take 1 tablet by mouth daily.    COMBIGAN 0.2-0.5 % ophthalmic solution Place 1 drop into both eyes 2 (two) times daily.     Cyanocobalamin (B-12) 2500 MCG TABS Take 2,500 mcg by mouth daily.    LACTOBACILLUS RHAMNOSUS, GG, PO Take 1 tablet by mouth daily.    latanoprost (XALATAN) 0.005 % ophthalmic solution Place 1 drop into both eyes at bedtime.    NAMENDA XR 28 MG CP24 24 hr capsule Take 28 mg by mouth daily.    rivastigmine (EXELON) 9.5 mg/24hr Place 1 patch onto the skin daily.    rosuvastatin (CRESTOR) 10 MG tablet Take 10 mg by mouth daily.    SYNTHROID 137 MCG tablet Take 137 mcg by mouth daily.        If you experience worsening of your admission symptoms, develop shortness of breath, life threatening emergency, suicidal or homicidal thoughts you must seek medical attention immediately by calling 911 or calling your MD immediately  if symptoms less severe.  You Must read complete instructions/literature along with all the possible adverse reactions/side effects for all the Medicines you take and that have been prescribed to you. Take any new Medicines after you have completely understood and accept all the possible adverse reactions/side effects.   Please note  You were cared for by a hospitalist during your hospital stay. If you have any questions about your discharge medications or the care you received while you were in the hospital after you are discharged, you can call the unit and asked to speak  with the hospitalist on call if the hospitalist that took care of you is not available. Once you are discharged, your primary care physician will handle any further medical issues. Please note that NO REFILLS for any discharge medications will be authorized once you are discharged, as it is imperative that you return to your primary care physician (or establish a relationship with a primary care physician if you do not have one) for your aftercare needs so that they can  reassess your need for medications and monitor your lab values. Today   SUBJECTIVE   Patient has dementia issues with memory. She is very pleasant. She is unable to tell me why she is here. Somewhat confused. Focal weaknesses noted. Moves all her extremities well.   VITAL SIGNS:  Blood pressure 116/66, pulse 65, temperature 97.4 F (36.3 C), temperature source Oral, resp. rate 16, height  (1.651 m), weight 71.4 kg (157 lb 6.4 oz), SpO2 92 %.  I/O:   Intake/Output Summary (Last 24 hours) at 08/08/16 1442 Last data filed at 08/08/16 0800  Gross per 24 hour  Intake              120 ml  Output                0 ml  Net              120 ml    PHYSICAL EXAMINATION:  GENERAL:  82 y.o.-year-old patient lying in the bed with no acute distress.  EYES: Pupils equal, round, reactive to light and accommodation. No scleral icterus. Extraocular muscles intact.  HEENT: Head atraumatic, normocephalic. Oropharynx and nasopharynx clear.  NECK:  Supple, no jugular venous distention. No thyroid enlargement, no tenderness.  LUNGS: Normal breath sounds bilaterally, no wheezing, rales,rhonchi or crepitation. No use of accessory muscles of respiration.  CARDIOVASCULAR: S1, S2 normal. No murmurs, rubs, or gallops.  ABDOMEN: Soft, non-tender, non-distended. Bowel sounds present. No organomegaly or mass.  EXTREMITIES: No pedal edema, cyanosis, or clubbing.  NEUROLOGIC: Cranial nerves II through XII are intact. Muscle strength 5/5 in all extremities. Sensation intact. Gait not checked. Moves all extremities well PSYCHIATRIC: The patient is alert and oriented but pleasantly confused due to dementia SKIN: No obvious rash, lesion, or ulcer.   DATA REVIEW:   CBC   Recent Labs Lab 08/07/16 1506  WBC 7.0  HGB 13.7  HCT 40.3  PLT 183    Chemistries   Recent Labs Lab 08/07/16 1558  NA 138  K 3.6  CL 105  CO2 25  GLUCOSE 97  BUN 8  CREATININE 0.67  CALCIUM 9.2  AST 23  ALT 15  ALKPHOS 77   BILITOT 1.2    Microbiology Results   No results found for this or any previous visit (from the past 240 hour(s)).  RADIOLOGY:  Dg Chest 1 View  Result Date: 08/07/2016 CLINICAL DATA:  Altered mental status. Weakness. Headache. Possible urinary tract infection. EXAM: CHEST 1 VIEW COMPARISON:  02/28/2015 FINDINGS: Heart size is normal. Aortic atherosclerosis. The lungs are clear. The vascularity is normal. No effusions. No significant bone finding. IMPRESSION: No active disease. Electronically Signed   By: Paulina Fusi M.D.   On: 08/07/2016 15:34   Ct Head Wo Contrast  Result Date: 08/07/2016 CLINICAL DATA:  Headache and altered mental status. History of Alzheimer's dementia EXAM: CT HEAD WITHOUT CONTRAST TECHNIQUE: Contiguous axial images were obtained from the base of the skull through the vertex without intravenous contrast. COMPARISON:  February 28, 2015 FINDINGS: Brain: There is extensive generalized atrophy, stable. There is no intracranial mass, hemorrhage, extra-axial fluid collection, or midline shift. There is small vessel disease throughout the centra semiovale bilaterally. There is small vessel disease throughout the anterior limbs of each external capsule, stable. Small vessel disease in each lateral thalamus is present. There is no appreciable acute infarct. Vascular: There is no hyperdense vessel. Mild increased attenuation and both middle cerebral arteries is stable compared to the prior study. There is calcification in each carotid siphon region. Skull: The bony calvarium appears intact. Sinuses/Orbits: There is diffuse opacification of most of the ethmoid air cells bilaterally. There is mucosal thickening in each frontal sinus inferiorly. There is mild mucosal thickening in each maxillary antrum. No air-fluid levels. There is rightward deviation of the nasal septum. Orbits appear symmetric bilaterally. Other: There is chronic inferior left mastoid air cell disease. Mastoids elsewhere  clear. IMPRESSION: Extensive atrophy with supratentorial small vessel disease, stable. No intracranial mass, hemorrhage, or extra-axial fluid collection. No acute appearing infarct. There are foci of arteriovascular calcification. There is extensive paranasal sinus disease, most marked in the ethmoid air cell regions as well as chronic inferior left mastoid disease. Electronically Signed   By: Bretta Bang III M.D.   On: 08/07/2016 15:28   Mr Brain Wo Contrast  Result Date: 08/08/2016 CLINICAL DATA:  Headache and altered mental status. Alzheimer's dementia. EXAM: MRI HEAD WITHOUT CONTRAST TECHNIQUE: Multiplanar, multiecho pulse sequences of the brain and surrounding structures were obtained without intravenous contrast. COMPARISON:  Head CT 08/07/2016 FINDINGS: Brain: Diffusion imaging does not show any acute or subacute infarction. There is generalized brain atrophy. Mild small vessel change affects the pons. No focal cerebellar insult. Cerebral hemispheres show confluent abnormal T2 and FLAIR signal throughout the white matter consistent with chronic small vessel ischemic change. No cortical or large vessel territory infarction. No mass lesion, hemorrhage, hydrocephalus or extra-axial collection. No pituitary mass. Vascular: Major vessels at the base of the brain show flow. Skull and upper cervical spine: Negative Sinuses/Orbits: Mild paranasal sinus mucosal thickening. Other: None significant IMPRESSION: No acute or reversible process. Generalized brain atrophy. Advanced chronic small vessel ischemic changes throughout the hemispheric white matter. Electronically Signed   By: Paulina Fusi M.D.   On: 08/08/2016 12:33   US Carotid Bilateral  Result Date: 08/08/2016 CLINICAL DATA:  CVA. EXAM: BILATERAL CAROTID DUPLEX ULTRASOUND TECHNIQUE: Wallace Cullens scale imaging, color Doppler and duplex ultrasound were performed of bilateral carotid and vertebral arteries in the neck. COMPARISON:  CT 08/07/2016. FINDINGS:  Criteria: Quantification of carotid stenosis is based on velocity parameters that correlate the residual internal carotid diameter with NASCET-based stenosis levels, using the diameter of the distal internal carotid lumen as the denominator for stenosis measurement. The following velocity measurements were obtained: RIGHT ICA:  75/7 cm/sec CCA:  120/8 cm/sec SYSTOLIC ICA/CCA RATIO:  0.6 DIASTOLIC ICA/CCA RATIO:  0.8 ECA:  114 cm/sec LEFT ICA:  58/12 cm/sec CCA:  102/11 cm/sec SYSTOLIC ICA/CCA RATIO:  0.6 DIASTOLIC ICA/CCA RATIO:  1.2 ECA:  88 cm/sec RIGHT CAROTID ARTERY: Mild diffuse atherosclerotic vascular disease. No flow limiting stenosis. Degree of stenosis less 50%. RIGHT VERTEBRAL ARTERY: Bidirectional flow noted in the right vertebral artery. LEFT CAROTID ARTERY: Mild diffuse left carotid atherosclerotic vascular calcification. No flow limiting stenosis. Degree of stenosis less than 50%. LEFT VERTEBRAL ARTERY:  Patent with antegrade flow. IMPRESSION: 1. Mild diffuse bilateral carotid atherosclerotic vascular disease. No flow limiting stenosis. Degree of stenosis less than 50%  bilaterally. 2. Right vertebral artery is patent with bidirectional flow. Left vertebral artery patent with antegrade flow. Electronically Signed   By: Maisie Fus  Register   On: 08/08/2016 12:25     Management plans discussed with the patient, family and they are in agreement.  CODE STATUS:     Code Status Orders        Start     Ordered   08/07/16 1837  Full code  Continuous     08/07/16 1837    Code Status History    Date Active Date Inactive Code Status Order ID Comments User Context   This patient has a current code status but no historical code status.      TOTAL TIME TAKING CARE OF THIS PATIENT: *40* minutes.    Arliss Frisina M.D on 08/08/2016 at 2:42 PM  Between 7am to 6pm - Pager - [phone] After 6pm go to [email or web address] - Social research officer, government  [facility]  Office   [phone]  CC: Primary care physician; Lynnea Ferrier, MD

## 2016-08-08 NOTE — Progress Notes (Signed)
*  PRELIMINARY RESULTS* Echocardiogram 2D Echocardiogram has been performed.  Cristela Blue 08/08/2016, 3:23 PM

## 2016-08-08 NOTE — Progress Notes (Signed)
SLP Cancellation Note  Patient Details Name: Jocelyn Gilbert MRN: [id number] DOB: Jul 12, 1934   Cancelled treatment:       Reason Eval/Treat Not Completed: Patient at procedure or test/unavailable (attempted x2 this morning. Will return in PM)    Jerilynn Som, MS, CCC-SLP Watson,Katherine 08/08/2016, 11:31 AM

## 2016-08-08 NOTE — Progress Notes (Signed)
PT Cancellation Note  Patient Details Name: Jocelyn Gilbert MRN: [id number] DOB: 09/25/1934   Cancelled Treatment:    Reason Eval/Treat Not Completed: Patient at procedure or test/unavailable.  Pt currently not in room.  Will re-attempt PT eval at a later date/time.  Hendricks Limes, PT 08/08/16, 11:01 AM [phone]

## 2016-08-08 NOTE — Evaluation (Signed)
Physical Therapy Evaluation Patient Details Name: Jocelyn Gilbert MRN: [id number] DOB: 03/05/1935 Today's Date: 08/08/2016   History of Present Illness  Pt is a 82 y.o. female with a history of Alzhiemer's disease, TIAs and thyroid disease admitted for TIA presenting with altered mental status, weakness, headache and some L sided facial droop. All symptoms appear to have resolved at this time.   Clinical Impression  Pt found lying across the bed near the foot of the bed and incontinent of urine upon entry (RN notified). Assisted pt to reposition back to lying in bed with mod assist, briefs were applied, and bed linens were chaged with assistance from nursing. Pt required mod assist with bed mobility, CGA for transfers, and CGA to min assist with HHA for ambulation and stairs. Pt is slightly unsteady on her feet, but per pt's husband appears to be close to baseline level of function and cognition. Pt will benefit from skilled PT during admission to increase balance and functional mobility. Recommend 24/7 supervision/assist with no PT follow-up as pt is close to baseline level of function.      Follow Up Recommendations No PT follow up;Supervision/Assistance - 24 hour    Equipment Recommendations       Recommendations for Other Services       Precautions / Restrictions Precautions Precautions: Fall Restrictions Weight Bearing Restrictions: No      Mobility  Bed Mobility Overal bed mobility: Needs Assistance Bed Mobility: Supine to Sit     Supine to sit: Mod assist     General bed mobility comments: mod assist for trunk support and scooting to the EOB; pt gets confused during tasks and required multiple cues to assist with scooting  Transfers Overall transfer level: Needs assistance Equipment used: 1 person hand held assist Transfers: Sit to/from Stand Sit to Stand: Min guard            Ambulation/Gait Ambulation/Gait assistance: Min guard;Min assist Ambulation  Distance (Feet): 280 Feet Assistive device: 1 person hand held assist     Gait velocity interpretation: Below normal speed for age/gender General Gait Details: Pt requires min guard and min assist with LOB x3 with ambulation; pt ambulates with a shuffling gate with narrow base of support and staggers to the both sides  Stairs Stairs: Yes Stairs assistance: Min guard;Min assist Stair Management: One rail Right Number of Stairs: 5 General stair comments: Pt went up 4 stairs with CGA holding on to the R rail and HHA on L while counting as she went up each step; pt slightly tripped over 5th step requiring min assist to correct LOB (susspected d/t being use to only 4 steps at home)  Wheelchair Mobility    Modified Rankin (Stroke Patients Only)       Balance Overall balance assessment: Needs assistance Sitting-balance support: No upper extremity supported;Feet supported Sitting balance-Leahy Scale: Fair   Postural control: Posterior lean Standing balance support: Single extremity supported Standing balance-Leahy Scale: Fair Standing balance comment: Pt able to stand on one leg to have sock adjusted while holding onto the bed rail w/o LOB               High Level Balance Comments: Pt frequently turning head R/L to look at her surroundings; pt stopped to turn her head when given v/c's to look at something on the wall             Pertinent Vitals/Pain Pain Assessment: No/denies pain    Home Living Family/patient expects to be  discharged to:: Private residence Living Arrangements: Spouse/significant other Available Help at Discharge: Family;Available 24 hours/day Type of Home: House Home Access: Stairs to enter Entrance Stairs-Rails: Right Entrance Stairs-Number of Steps: 4 Home Layout: One level Home Equipment: Shower seat - built in;Hand held shower head Additional Comments: Pt lives with her husband who is with her 24/7; pt usually intermittently holds onto her  husband with ambulation; pt's daughter is a Engineer, civil (consulting) and provides support as needed.    Prior Function Level of Independence: Needs assistance      ADL's / Homemaking Assistance Needed: Pt's husband assists with ADLs and IADLs        Hand Dominance        Extremity/Trunk Assessment   Upper Extremity Assessment Upper Extremity Assessment: Overall WFL for tasks assessed    Lower Extremity Assessment Lower Extremity Assessment: Overall WFL for tasks assessed    Cervical / Trunk Assessment Cervical / Trunk Assessment: Normal  Communication   Communication: Expressive difficulties  Cognition Arousal/Alertness: Awake/alert Behavior During Therapy: Impulsive Overall Cognitive Status: History of cognitive impairments - at baseline                                 General Comments: Pt is slightly impulsive and pleasently confused, her husband reports that pt is a baseline      General Comments      Exercises     Assessment/Plan    PT Assessment Patient needs continued PT services  PT Problem List Decreased safety awareness;Decreased balance       PT Treatment Interventions DME instruction;Therapeutic activities;Gait training;Therapeutic exercise;Patient/family education;Stair training;Balance training;Functional mobility training    PT Goals (Current goals can be found in the Care Plan section)  Acute Rehab PT Goals Patient Stated Goal: to go home PT Goal Formulation: With patient/family Time For Goal Achievement: 08/22/16    Frequency Min 2X/week   Barriers to discharge        Co-evaluation               AM-PAC PT "6 Clicks" Daily Activity  Outcome Measure Difficulty turning over in bed (including adjusting bedclothes, sheets and blankets)?: None Difficulty moving from lying on back to sitting on the side of the bed? : Total Difficulty sitting down on and standing up from a chair with arms (e.g., wheelchair, bedside commode, etc,.)?:  None Help needed moving to and from a bed to chair (including a wheelchair)?: A Little Help needed walking in hospital room?: A Little Help needed climbing 3-5 steps with a railing? : A Little 6 Click Score: 18    End of Session Equipment Utilized During Treatment: Gait belt Activity Tolerance: Patient tolerated treatment well Patient left: in bed;with call bell/phone within reach;with bed alarm set;with family/visitor present Nurse Communication: Mobility status PT Visit Diagnosis: Unsteadiness on feet (R26.81);Other abnormalities of gait and mobility (R26.89)    Time: 1457-1530 PT Time Calculation (min) (ACUTE ONLY): 33 min   Charges:         PT G Codes:         Bayard More, SPT 08/08/2016, 3:56 PM

## 2016-08-08 NOTE — Progress Notes (Signed)
Discharge instructions reviewed with patient's spouse Channing Mutters. IV removed without complications or discomfort. Telemetry returned. Questions answered. Channing Mutters confirmed follow up appoint with Dr.Kline. Resident left unit via w/c.

## 2016-12-29 IMAGING — CR DG PELVIS 1-2V
1 series · 1 of 1 positions shown · non-contrast
Comparison: None.

CLINICAL DATA: Fall

EXAM:
PELVIS - 1-2 VIEW

[pelvis ap]
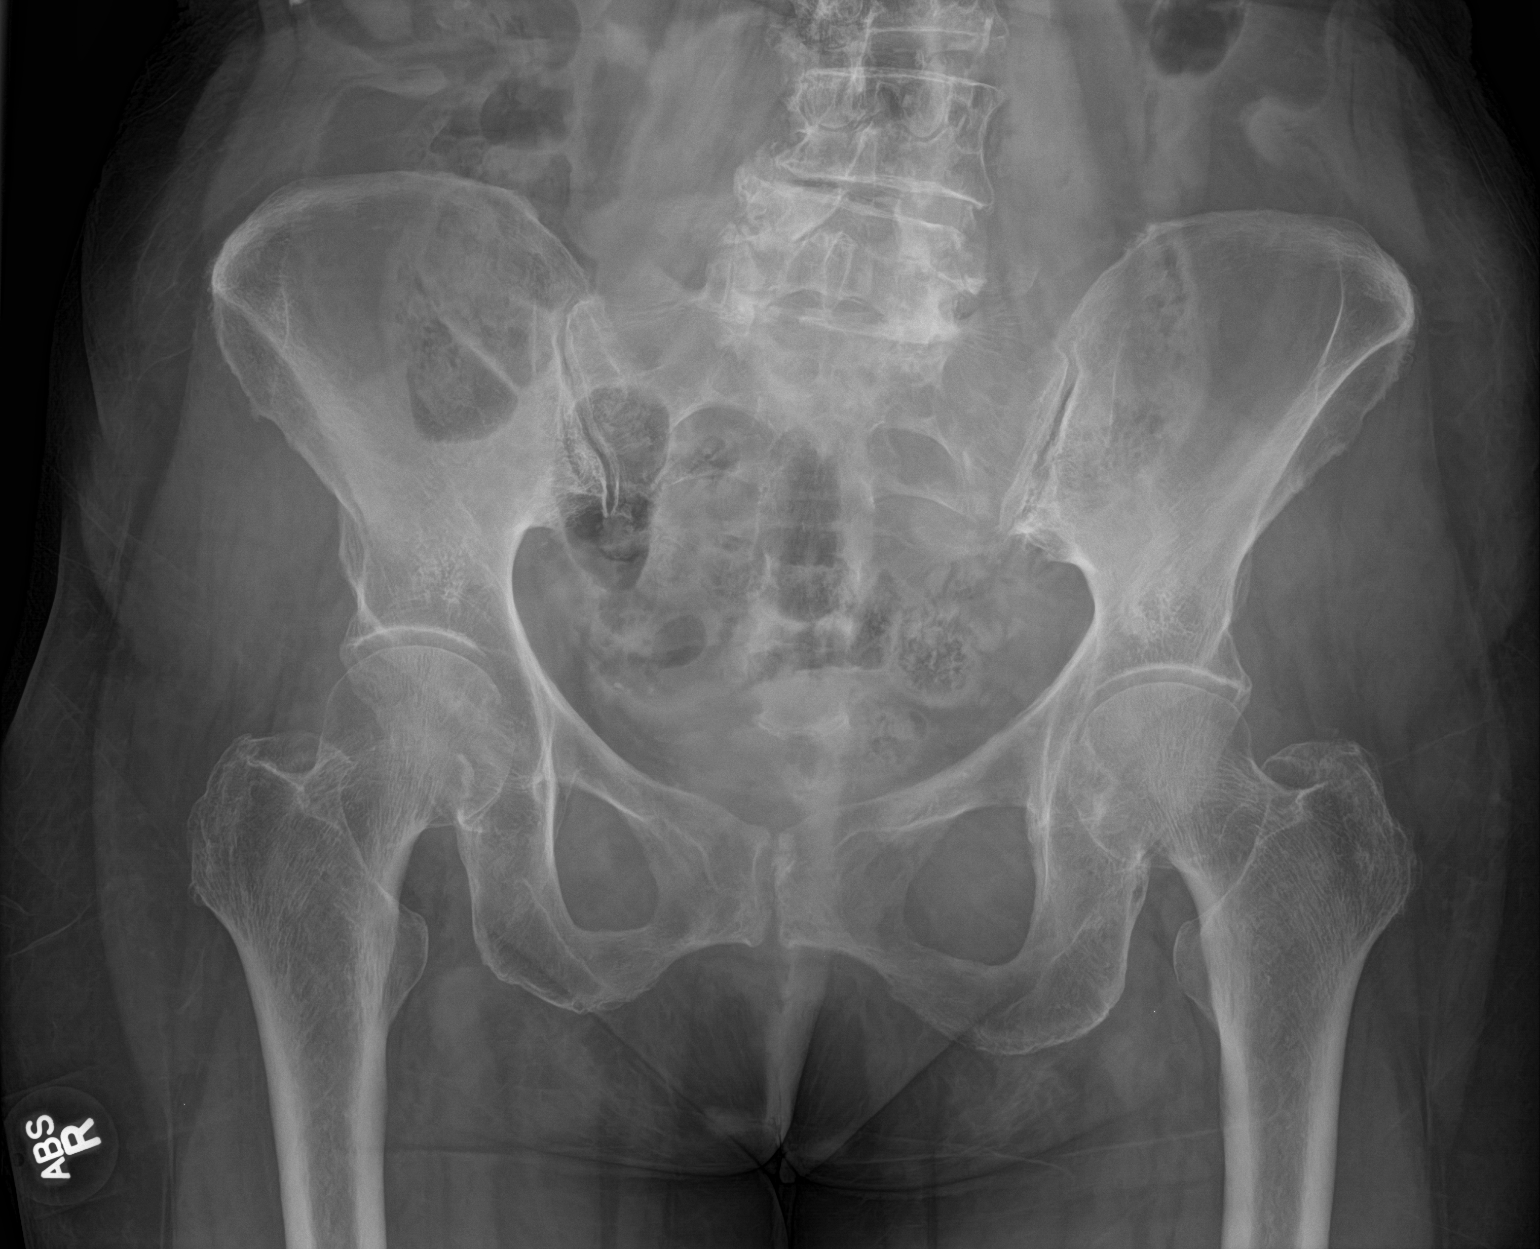

[1 of 1 positions shown; findings below may reference images not displayed]

FINDINGS: No acute fracture. No dislocation. Osteopenia. Degenerative changes
in the lower lumbar spine are prominent.
IMPRESSION: No acute bony pathology.

## 2017-10-16 ENCOUNTER — Encounter: Payer: Self-pay | Admitting: Emergency Medicine

## 2017-10-16 ENCOUNTER — Other Ambulatory Visit: Payer: Self-pay

## 2017-10-16 ENCOUNTER — Emergency Department
Admission: EM | Admit: 2017-10-16 | Discharge: 2017-10-16 | Disposition: A | Discharge status: Home / Self Care | Payer: Medicare Other | Attending: Emergency Medicine | Admitting: Emergency Medicine

## 2017-10-16 ENCOUNTER — Emergency Department: Payer: Medicare Other

## 2017-10-16 DIAGNOSIS — S0990XA Unspecified injury of head, initial encounter: Secondary | ICD-10-CM

## 2017-10-16 DIAGNOSIS — S0083XA Contusion of other part of head, initial encounter: Secondary | ICD-10-CM | POA: Insufficient documentation

## 2017-10-16 DIAGNOSIS — R51 Headache: Secondary | ICD-10-CM | POA: Insufficient documentation

## 2017-10-16 DIAGNOSIS — Y93E1 Activity, personal bathing and showering: Secondary | ICD-10-CM | POA: Insufficient documentation

## 2017-10-16 DIAGNOSIS — Z87891 Personal history of nicotine dependence: Secondary | ICD-10-CM | POA: Insufficient documentation

## 2017-10-16 DIAGNOSIS — Z7982 Long term (current) use of aspirin: Secondary | ICD-10-CM | POA: Insufficient documentation

## 2017-10-16 DIAGNOSIS — F0280 Dementia in other diseases classified elsewhere without behavioral disturbance: Secondary | ICD-10-CM | POA: Insufficient documentation

## 2017-10-16 DIAGNOSIS — W182XXA Fall in (into) shower or empty bathtub, initial encounter: Secondary | ICD-10-CM | POA: Diagnosis not present

## 2017-10-16 DIAGNOSIS — Z79899 Other long term (current) drug therapy: Secondary | ICD-10-CM | POA: Diagnosis not present

## 2017-10-16 DIAGNOSIS — Y92002 Bathroom of unspecified non-institutional (private) residence single-family (private) house as the place of occurrence of the external cause: Secondary | ICD-10-CM | POA: Diagnosis not present

## 2017-10-16 DIAGNOSIS — S0993XA Unspecified injury of face, initial encounter: Secondary | ICD-10-CM | POA: Diagnosis present

## 2017-10-16 DIAGNOSIS — Y999 Unspecified external cause status: Secondary | ICD-10-CM | POA: Diagnosis not present

## 2017-10-16 DIAGNOSIS — G309 Alzheimer's disease, unspecified: Secondary | ICD-10-CM | POA: Diagnosis not present

## 2017-10-16 NOTE — ED Triage Notes (Addendum)
Pt to triage via w/c, appears very anxious, accomp by husband; Pt slipped getting out of shower and husband caught her but hit head on something; hematoma noted to mid forehead; pt with alzheimers and can only point upwards and st "hurts"; no grimacing with palpation of neck/back; MAEW; no other injuries noted; no grimacing with palpations of extremities/chest/abd/pelvis

## 2017-10-16 NOTE — ED Provider Notes (Signed)
[facility] Emergency Department Provider Note  Time seen: 10:40 PM  I have reviewed the triage vital signs and the nursing notes.   HISTORY  Chief Complaint Head Injury    HPI Jocelyn Gilbert is a 83 y.o. female with a past medical history of Alzheimer's dementia, presents to the emergency department after a fall.  According to the husband around 4 PM today patient slipped getting out of the shower and fell hitting her head.  He states the patient had been doing well after that, has been ambulatory, they were sitting on the front porch for a while.  However tonight around 7 PM she began complaining of headache and he noted that she had a bruise to her forehead.  She has a history of dementia and cannot provide much history, so the husband was concerned so brought her in for evaluation.  Patient is calm, cooperative, no distress but cannot contribute to her history or review of systems.  Husband witnessed the fall and denies any loss of consciousness.  Past Medical History:  Diagnosis Date  . Alzheimer disease   . History of TIAs   . Thyroid disease     Patient Active Problem List   Diagnosis Date Noted  . TIA (transient ischemic attack) 08/07/2016    Past Surgical History:  Procedure Laterality Date  . EYE SURGERY      Prior to Admission medications   Medication Sig Start Date End Date Taking? Authorizing Provider  aspirin EC 81 MG tablet Take 81 mg by mouth daily.    [provider]  calcium carbonate (CALCIUM 600) 600 MG TABS tablet Take 1 tablet by mouth daily.    [provider]  COMBIGAN 0.2-0.5 % ophthalmic solution Place 1 drop into both eyes 2 (two) times daily.  06/26/16   [provider]  Cyanocobalamin (B-12) 2500 MCG TABS Take 2,500 mcg by mouth daily.    [provider]  LACTOBACILLUS RHAMNOSUS, GG, PO Take 1 tablet by mouth daily.    [provider]  latanoprost (XALATAN) 0.005 % ophthalmic  solution Place 1 drop into both eyes at bedtime. 07/13/16   [provider]  NAMENDA XR 28 MG CP24 24 hr capsule Take 28 mg by mouth daily. 06/19/16   [provider]  rivastigmine (EXELON) 9.5 mg/24hr Place 1 patch onto the skin daily. 06/26/16   [provider]  rosuvastatin (CRESTOR) 10 MG tablet Take 10 mg by mouth daily.    [provider]  SYNTHROID 137 MCG tablet Take 137 mcg by mouth daily. 06/27/16   [provider]    No Known Allergies  No family history on file.  Social History Social History   Tobacco Use  . Smoking status: Former Smoker    Types: Cigarettes  . Smokeless tobacco: Never Used  Substance Use Topics  . Alcohol use: Yes    Comment: occas  . Drug use: No    Review of Systems Unable to complete an adequate review of systems due to dementia.  ____________________________________________   PHYSICAL EXAM:  VITAL SIGNS: ED Triage Vitals  Enc Vitals Group     BP 10/16/17 1947 (!) 142/86     Pulse Rate 10/16/17 1947 61     Resp 10/16/17 1947 20     Temp 10/16/17 1947 97.9 F (36.6 C)     Temp Source 10/16/17 1947 Oral     SpO2 10/16/17 1947 99 %     Weight 10/16/17 1948  146 lb (66.2 kg)     Height 10/16/17 1948 5\' 4"  (1.626 m)     Head Circumference --      Peak Flow --      Pain Score --      Pain Loc --      Pain Edu? --      Excl. in GC? --    Constitutional: Patient is alert, well-appearing, cooperative. Eyes: Normal exam ENT   Head: Patient does have a small bruise/hematoma to the central forehead.   Mouth/Throat: Mucous membranes are moist. Cardiovascular: Normal rate, regular rhythm.  Respiratory: Normal respiratory effort without tachypnea nor retractions. Breath sounds are clear Gastrointestinal: Soft and nontender. No distention. Musculoskeletal: Nontender with normal range of motion in all extremities.  Neurologic:  Normal speech and language. No gross focal neurologic deficits Skin:   Skin is warm, dry, bruise to center of the forehead. Psychiatric: Mood and affect are normal.    ____________________________________________   RADIOLOGY  CT scan of the head and neck are negative for acute abnormality.  ____________________________________________   INITIAL IMPRESSION / ASSESSMENT AND PLAN / ED COURSE  Pertinent labs & imaging results that were available during my care of the patient were reviewed by me and considered in my medical decision making (see chart for details).  Patient presents to the emergency department after a witnessed fall by her husband.  Differential would include ICH, concussion, fracture.  CT scans of the head and neck are negative.  Patient has been ambulatory since the event, no other traumatic findings on exam besides small hematoma/bruise to the forehead.  We will discharge the patient home with routine follow-up.  Has been agreeable to plan of care.  ____________________________________________   FINAL CLINICAL IMPRESSION(S) / ED DIAGNOSES  Closed head injury Thresa RossFall    Dajane Valli, MD 10/16/17 2243

## 2017-10-16 NOTE — ED Notes (Signed)

## 2018-03-11 ENCOUNTER — Other Ambulatory Visit: Payer: Medicare Other

## 2018-05-09 ENCOUNTER — Other Ambulatory Visit: Payer: Medicare Other

## 2018-05-09 DIAGNOSIS — Z515 Encounter for palliative care: Secondary | ICD-10-CM

## 2018-05-10 NOTE — Progress Notes (Signed)
COMMUNITY PALLIATIVE CARE SW NOTE  PATIENT NAME: Jocelyn Gilbert DOB: 20-Feb-1935 MRN: [id number]  PRIMARY CARE PROVIDER: Lynnea Ferrier, MD  RESPONSIBLE PARTY:  Acct ID - Guarantor Home Phone Work Phone Relationship Acct Type  [id number] - [place]* [phone] [id number] Self P/F     [address], [place], [place] [address]     PLAN OF CARE and INTERVENTIONS:             1. GOALS OF CARE/ ADVANCE CARE PLANNING:  Patient has active DNR in the home.  2. SOCIAL/EMOTIONAL/SPIRITUAL ASSESSMENT/ INTERVENTIONS:  Visit with patient, patient's husband Channing Mutters, daughter Loa Socks and Verlon Au, NP at husband's request. Husband reports patient has shown signs of decline since last visit and requested patient be "reassessed to determine where we are". Patient able to ambulate on her own and is observed walking from bedroom to dining room. Husband states patient is having more trouble with incontinence and rigidity at times. Patient is unable to speak more than 1-2 comprehensible words at a time, but can still smile when smiled at. Patient's family continues to be involved in her care, as they visit often and will take patient on short outings.  3. PATIENT/CAREGIVER EDUCATION/ COPING:  Husband reports he is still adapting to this "new normal" and getting used to not having the same life he had prior to patient's decline in health. SW provided husband and daughter with a handout that describes disease progression and what to expect with someone with dementia. Positive communication tips provided. Much active listening provided to husband during this visit as he spoke about patient's needs. Husband reluctant to act on most of the suggestions provided by SW and NP in regards to increasing help in the home, having patient participate in day program specifically for dementia patients etc. Husband offers reasons why these suggestions wont work, despite SW and NP explanation/education. Ongoing support to be provided as  husband finds balance between accepting patient's new normal and maintaining roles/activities/expectations of the past.  4. PERSONAL EMERGENCY PLAN:  Husband states they will call 911 for emergencies. SW left information for MedAlert with patient and family as this would be beneficial for husband due to his own medical/health concerns. Instructed on the use of non-emergency 911 for patient when she falls- as this has been happening more frequently. 5. COMMUNITY RESOURCES COORDINATION/ HEALTH CARE NAVIGATION:  Resource information left on MedAlert, and private hire sitters. Husband has completed paperwork for VA aid and attendance services and is going to submit to [place] on his next appointment.  6. FINANCIAL/LEGAL CONCERNS/INTERVENTIONS:  none     SOCIAL HX:  Social History   Tobacco Use  . Smoking status: Former Smoker    Types: Cigarettes  . Smokeless tobacco: Never Used  Substance Use Topics  . Alcohol use: Yes    Comment: occas    CODE STATUS:   Code Status: Prior  ADVANCED DIRECTIVES: N MOST FORM COMPLETE:  no HOSPICE EDUCATION PROVIDED: None- patient not appropriate at this time.   I spent 115 minutes with this patient and family from 10:00am-11:55am providing support and consultation.  HKG:OVPCHEK ambulates on her own, has incontinence at times and needs assistance with feeding as she can not remember how to use utensils.       Guilford Shi

## 2018-06-21 ENCOUNTER — Emergency Department (HOSPITAL_COMMUNITY): Payer: Medicare Other

## 2018-06-21 ENCOUNTER — Emergency Department (HOSPITAL_COMMUNITY)
Admission: EM | Admit: 2018-06-21 | Discharge: 2018-06-21 | Disposition: A | Discharge status: Home / Self Care | Payer: Medicare Other | Attending: Emergency Medicine | Admitting: Emergency Medicine

## 2018-06-21 ENCOUNTER — Other Ambulatory Visit: Payer: Self-pay

## 2018-06-21 ENCOUNTER — Encounter (HOSPITAL_COMMUNITY): Payer: Self-pay

## 2018-06-21 DIAGNOSIS — Z7982 Long term (current) use of aspirin: Secondary | ICD-10-CM | POA: Diagnosis not present

## 2018-06-21 DIAGNOSIS — Z87891 Personal history of nicotine dependence: Secondary | ICD-10-CM | POA: Insufficient documentation

## 2018-06-21 DIAGNOSIS — Z79899 Other long term (current) drug therapy: Secondary | ICD-10-CM | POA: Insufficient documentation

## 2018-06-21 DIAGNOSIS — F0390 Unspecified dementia without behavioral disturbance: Secondary | ICD-10-CM | POA: Diagnosis not present

## 2018-06-21 DIAGNOSIS — G40909 Epilepsy, unspecified, not intractable, without status epilepticus: Secondary | ICD-10-CM | POA: Diagnosis present

## 2018-06-21 LAB — CBC WITH DIFFERENTIAL/PLATELET
ABS IMMATURE GRANULOCYTES: 0.02 10*3/uL (ref 0.00–0.07)
BASOS PCT: 1 %
Basophils Absolute: 0 10*3/uL (ref 0.0–0.1)
EOS ABS: 0 10*3/uL (ref 0.0–0.5)
Eosinophils Relative: 0 %
HCT: 40.9 % (ref 36.0–46.0)
Hemoglobin: 13.9 g/dL (ref 12.0–15.0)
Immature Granulocytes: 0 %
Lymphocytes Relative: 15 %
Lymphs Abs: 1 10*3/uL (ref 0.7–4.0)
MCH: 30.3 pg (ref 26.0–34.0)
MCHC: 34 g/dL (ref 30.0–36.0)
MCV: 89.1 fL (ref 80.0–100.0)
MONO ABS: 0.3 10*3/uL (ref 0.1–1.0)
Monocytes Relative: 5 %
Neutro Abs: 5.1 10*3/uL (ref 1.7–7.7)
Neutrophils Relative %: 79 %
PLATELETS: 180 10*3/uL (ref 150–400)
RBC: 4.59 MIL/uL (ref 3.87–5.11)
RDW: 12.1 % (ref 11.5–15.5)
WBC: 6.5 10*3/uL (ref 4.0–10.5)
nRBC: 0 % (ref 0.0–0.2)

## 2018-06-21 LAB — URINALYSIS, ROUTINE W REFLEX MICROSCOPIC
BILIRUBIN URINE: NEGATIVE
Glucose, UA: NEGATIVE mg/dL
KETONES UR: NEGATIVE mg/dL
NITRITE: NEGATIVE
PROTEIN: NEGATIVE mg/dL
SPECIFIC GRAVITY, URINE: 1.013 (ref 1.005–1.030)
pH: 7 (ref 5.0–8.0)

## 2018-06-21 LAB — COMPREHENSIVE METABOLIC PANEL
ALT: 18 U/L (ref 0–44)
AST: 24 U/L (ref 15–41)
Albumin: 3.6 g/dL (ref 3.5–5.0)
Alkaline Phosphatase: 65 U/L (ref 38–126)
Anion gap: 11 (ref 5–15)
BILIRUBIN TOTAL: 1.4 mg/dL — AB (ref 0.3–1.2)
BUN: 10 mg/dL (ref 8–23)
CALCIUM: 9.1 mg/dL (ref 8.9–10.3)
CHLORIDE: 110 mmol/L (ref 98–111)
CO2: 20 mmol/L — ABNORMAL LOW (ref 22–32)
CREATININE: 0.67 mg/dL (ref 0.44–1.00)
Glucose, Bld: 123 mg/dL — ABNORMAL HIGH (ref 70–99)
Potassium: 3.4 mmol/L — ABNORMAL LOW (ref 3.5–5.1)
Sodium: 141 mmol/L (ref 135–145)
TOTAL PROTEIN: 6.4 g/dL — AB (ref 6.5–8.1)

## 2018-06-21 MED ORDER — SODIUM CHLORIDE 0.9 % IV BOLUS
500.0000 mL | Freq: Once | INTRAVENOUS | Status: AC
Start: 2018-06-21 — End: 2018-06-21
  Administered 2018-06-21: 500 mL via INTRAVENOUS

## 2018-06-21 MED ORDER — SODIUM CHLORIDE 0.9 % IV BOLUS
500.0000 mL | Freq: Once | INTRAVENOUS | Status: AC
  Administered 2018-06-21: 500 mL via INTRAVENOUS

## 2018-06-21 MED ORDER — SODIUM CHLORIDE 0.9 % IV BOLUS: 1000.0000 mL | Freq: Once | INTRAVENOUS | Status: DC

## 2018-06-21 NOTE — ED Provider Notes (Signed)
  Physical Exam  BP 124/70   Pulse 60   Temp (!) 96.5 F (35.8 C) (Axillary)   Resp 17   Ht 5\' 2"  (1.575 m)   Wt 60.3 kg   SpO2 100%   BMI 24.33 kg/m   Physical Exam  ED Course/Procedures     Procedures  MDM  Care assumed at 4 pm. Patient has hx of dementia here with seizure like activity. Finished abx for UTI. CT head showed possible previous stroke. Nonfocal neuro exam per Dr. Adriana Simas. Patient back to baseline. Sign out pending UA.  5:18 PM UA showed neg nitrate, mod leuk but 0-5 WBC. Likely consistent with recently treated UTI. Urine culture sent, hold abx for now. Will dc home.      Charlynne Pander, MD 06/21/18 [phone]

## 2018-06-21 NOTE — ED Notes (Signed)
Patient verbalizes understanding of discharge instructions. Opportunity for questioning and answers were provided. Armband removed by staff, pt discharged from ED in wheelchair.  

## 2018-06-21 NOTE — ED Notes (Signed)
ED Provider at bedside. 

## 2018-06-21 NOTE — ED Triage Notes (Signed)
[facility] reports pt had possible seizure after being on facetime with family. Husband reported some staring and right sided neglect since Tuesday. [facility] reports brady into 30's.

## 2018-06-21 NOTE — ED Provider Notes (Signed)
[facility] EMERGENCY DEPARTMENT Provider Note   CSN: [id number] Arrival date & time: 06/21/18  1158    History   Chief Complaint Chief Complaint  Patient presents with   Seizures    HPI Jocelyn Gilbert is a 84 y.o. female.     Level 5 caveat for dementia.  History obtained from husband.  Patient lives independently with her husband, but he assists her with all her activities of daily living secondary to her dementia.  Earlier today the patient grabbed onto the bedpost, started shaking, and slid to the floor.  Husband described as a "seizure", but there was no postictal behavior.  Patient was crying after the episode.  Event lasted 30 to 40 seconds.  She was treated last week for a UTI, but is not currently not on antibiotics.  No other prodromal illnesses.     Past Medical History:  Diagnosis Date   Alzheimer disease (HCC)    History of TIAs    Thyroid disease     Patient Active Problem List   Diagnosis Date Noted   TIA (transient ischemic attack) 08/07/2016    Past Surgical History:  Procedure Laterality Date   EYE SURGERY       OB History   No obstetric history on file.      Home Medications    Prior to Admission medications   Medication Sig Start Date End Date Taking? Authorizing Provider  aspirin EC 81 MG tablet Take 81 mg by mouth daily.    [provider]  calcium carbonate (CALCIUM 600) 600 MG TABS tablet Take 1 tablet by mouth daily.    [provider]  COMBIGAN 0.2-0.5 % ophthalmic solution Place 1 drop into both eyes 2 (two) times daily.  06/26/16   [provider]  Cyanocobalamin (B-12) 2500 MCG TABS Take 2,500 mcg by mouth daily.    [provider]  LACTOBACILLUS RHAMNOSUS, GG, PO Take 1 tablet by mouth daily.    [provider]  latanoprost (XALATAN) 0.005 % ophthalmic solution Place 1 drop into both eyes at bedtime. 07/13/16   [provider]  NAMENDA XR 28 MG CP24 24  hr capsule Take 28 mg by mouth daily. 06/19/16   [provider]  rivastigmine (EXELON) 9.5 mg/24hr Place 1 patch onto the skin daily. 06/26/16   [provider]  rosuvastatin (CRESTOR) 10 MG tablet Take 10 mg by mouth daily.    [provider]  SYNTHROID 137 MCG tablet Take 137 mcg by mouth daily. 06/27/16   [provider]    Family History History reviewed. No pertinent family history.  Social History Social History   Tobacco Use   Smoking status: Former Smoker    Types: Cigarettes   Smokeless tobacco: Never Used  Substance Use Topics   Alcohol use: Yes    Comment: occas   Drug use: No     Allergies   Patient has no known allergies.   Review of Systems Review of Systems  Unable to perform ROS: Dementia     Physical Exam Updated Vital Signs BP 124/70    Pulse 60    Temp (!) 96.5 F (35.8 C) (Axillary)    Resp 17    Ht 5\' 2"  (1.575 m)    Wt 60.3 kg    SpO2 100%    BMI 24.33 kg/m   Physical Exam Vitals signs and nursing note reviewed.  Constitutional:      Appearance: She is well-developed.  Comments: Crying, demented, unable to give history  HENT:     Head: Normocephalic and atraumatic.  Eyes:     Conjunctiva/sclera: Conjunctivae normal.  Neck:     Musculoskeletal: Neck supple.  Cardiovascular:     Rate and Rhythm: Normal rate and regular rhythm.  Pulmonary:     Effort: Pulmonary effort is normal.     Breath sounds: Normal breath sounds.  Abdominal:     General: Bowel sounds are normal.     Palpations: Abdomen is soft.  Musculoskeletal: Normal range of motion.  Skin:    General: Skin is warm and dry.  Neurological:     Comments: Moves arms and legs appropriately  Psychiatric:     Comments: Demented (normal behavior)      ED Treatments / Results  Labs (all labs ordered are listed, but only abnormal results are displayed) Labs Reviewed  COMPREHENSIVE METABOLIC PANEL - Abnormal; Notable for the following  components:      Result Value   Potassium 3.4 (*)    CO2 20 (*)    Glucose, Bld 123 (*)    Total Protein 6.4 (*)    Total Bilirubin 1.4 (*)    All other components within normal limits  CBC WITH DIFFERENTIAL/PLATELET  URINALYSIS, ROUTINE W REFLEX MICROSCOPIC    EKG None  Radiology Ct Head Wo Contrast  Result Date: 06/21/2018 CLINICAL DATA:  Seizure.  Right-sided neglect EXAM: CT HEAD WITHOUT CONTRAST TECHNIQUE: Contiguous axial images were obtained from the base of the skull through the vertex without intravenous contrast. COMPARISON:  October 16, 2017 FINDINGS: Brain: There is extensive generalized atrophy, stable. There is no appreciable intracranial mass, hemorrhage, extra-axial fluid collection, or midline shift. There is decreased attenuation in the periventricular white matter, likely due to small vessel disease. There may be a mild degree of superimposed interstitial edema from the ventricular enlargement. The appearance is stable. There is decreased attenuation felt to represent small vessel disease in the anterior limbs of each external capsule. There is equivocal decreased attenuation in the right lower midbrain and upper pons. An age uncertain and potentially recent infarct in this area can not be excluded. Vascular: No hyperdense vessel appreciable. There is calcification in each carotid siphon and distal vertebral artery region. Skull: The bony calvarium appears intact. Sinuses/Orbits: There are small retention cysts in each inferior maxillary antrum. There is mucosal thickening involving multiple ethmoid air cells. There is rightward deviation of the nasal septum. Orbits appear symmetric bilaterally. Other: Mastoid air cells are clear. IMPRESSION: Extensive generalized atrophy remains stable. There is supratentorial small vessel disease, stable. There is an age uncertain apparent infarct in the right lower midbrain/upper pons region. A recent small infarct in this area is possible. No  hemorrhage or mass. There are foci of arterial vascular calcification. There is multifocal paranasal sinus disease. Rightward deviation of nasal septum noted. Electronically Signed   By: Bretta Bang III M.D.   On: 06/21/2018 13:41    Procedures Procedures (including critical care time)  Medications Ordered in ED Medications  sodium chloride 0.9 % bolus 500 mL (0 mLs Intravenous Stopped 06/21/18 1257)  sodium chloride 0.9 % bolus 500 mL (500 mLs Intravenous New Bag/Given 06/21/18 1512)     Initial Impression / Assessment and Plan / ED Course  I have reviewed the triage vital signs and the nursing notes.  Pertinent labs & imaging results that were available during my care of the patient were reviewed by me and considered in my medical decision  making (see chart for details).        Husband reports an episode where the patient hugged the bedpost, started shaking, slid to the floor.  He described it as a seizure, but I am not sure of this.  She does not appear to be postictal in the emergency department.  CT head reveals extensive atrophy and an age uncertain infarct in the right lower mid brain/upper pons region.  Patient was observed for several hours.  Shared decision making with the husband and son.  Will discharge home pending urine results.  If urine negative, no antibiotics.  If urine positive, will start oral antibiotic.  Family agrees with this treatment plan.  Discussed with Dr. Silverio Lay.  Final Clinical Impressions(s) / ED Diagnoses   Final diagnoses:  Dementia without behavioral disturbance, unspecified dementia type [facility])    ED Discharge Orders    None       Donnetta Hutching, MD 06/21/18 1555

## 2018-06-21 NOTE — ED Notes (Signed)
Patient transported to CT 

## 2018-06-21 NOTE — ED Notes (Signed)
Pt has had alzheimer's for 15 yrs and is very impaired. Husband is at bedside.

## 2018-06-21 NOTE — Discharge Instructions (Addendum)
Continue your current meds.   See your doctor. Consider following up with a neurologist   Return to ER if you have lethargy, altered, seizure like activity

## 2018-06-21 NOTE — ED Notes (Signed)
Pt. Placed on purewick, no urine at this time.

## 2018-06-23 LAB — URINE CULTURE

## 2018-07-07 ENCOUNTER — Telehealth: Payer: Self-pay | Admitting: Student

## 2018-07-07 NOTE — Telephone Encounter (Signed)
Palliative NP returned call to patient's husband Mr. Tessmer. He provided update on patient's recent emergency room visit almost 2 weeks ago. He also reports a similar episode a week later, on Saturday where she gets weak, her eyes were fixated, shaking all over and then patient "came out of it." We discussed recent CT scan results, risk for CVA, falls risk. Patient is not taking aspirin and was told to stop by a doctor, but Mr. Corporan could not recall why. He is going to correspond with Neurologist to see if patient can restart. We also discussed questionable drop in her blood pressure when switching positions too fast; he is going to monitor for this. He denies any other needs at this time. He is made aware that Palliative Medicine can provide a tele-health visit during Covid-19 crisis. He is encouraged to call with questions.

## 2018-08-11 ENCOUNTER — Telehealth: Payer: Self-pay | Admitting: Student

## 2018-08-11 NOTE — Telephone Encounter (Signed)
NP received call from patient's husband Channing Mutters. He states she has been lethargic today, while she is usually more awake and alert. He has private caregivers from [place] in 3 times a week and they advised him to give Palliative a call. He states her vital signs have all been okay; denies any other acute changes or emergent needs. He does report weight loss in the past 3 months. Palliative medicine visit scheduled for 08/14/2018 at 1pm. He is encouraged to call if any changes or needs arise.

## 2018-08-14 ENCOUNTER — Other Ambulatory Visit: Payer: Medicare Other | Admitting: Student

## 2018-08-14 DIAGNOSIS — Z515 Encounter for palliative care: Secondary | ICD-10-CM

## 2018-08-14 NOTE — Progress Notes (Signed)
Therapist, nutritional Palliative Care Consult Note Telephone: [phone]  Fax: [phone]  PATIENT NAME: Jocelyn Gilbert DOB: January 27, 1935 MRN: [id number]  PRIMARY CARE PROVIDER:   Lynnea Ferrier, MD  REFERRING PROVIDER:  Lynnea Ferrier, MD [address] [place] [place] [address]  RESPONSIBLE PARTY: Husband, Jerrie Windish    ASSESSMENT: Jocelyn Gilbert is resting in bed upon arrival. Husband and daughter Loa Socks is present. Jocelyn Gilbert has pleasant affect. She does attempt to answer some direct questions; she has expressive aphasia. She was observed ambulating to and from bathroom with hand-held assistance, gait is shuffled. We discussed ongoing goals of care. We discussed symptom management. We discussed her weight loss at length. We discussed code status. Mr. Cambridge would like for patient to remain in the home for as long as possible. He would like for dignity to be maintained and patient be treated with respect. We discussed her disease processes. We discussed Palliative vs. Hospice. Mr. Baken expresses that he knows death is inevitable. He would like to discuss Hospice more in depth on next visit.        RECOMMENDATIONS and PLAN:  1. Code status: DNR. 2. Medical goals of therapy: Palliative Medicine will focus on symptom management; will monitor for changes and declines and make recommendations as necessary.  3. Symptom management: Weight loss: education provided on nutritional supplements, protein rich foods, increased calorie foods.  4. Discharge Planning: she will continue to reside at home with husband, private caregivers 3 times a week.   Palliative Medicine to follow up with phone call in 4 weeks or sooner, if needed.   I spent 40 minutes providing this consultation,  from 1:00pm to 1:40pm. More than 50% of the time in this consultation was spent coordinating communication.   HISTORY OF PRESENT ILLNESS:  Hemi Deese  Bacci is a 84 y.o. female with multiple medical problems including mixed Alzheimer's and vascular dementia, hypothyroidism, hyperlipidemia, hx of TIA, Vitamin B12 deficiency, osteopenia, Vitamin D insufficiency, depression. Palliative Care was asked to help address goals of care; she is seen for follow up visit today. Jocelyn Gilbert resides at home with her husband Channing Mutters, daughter Loa Socks visits several times a week and she also has caregivers three times a week through [place]. She is dependent for adl's. She is ambulatory with hand held assist. No recent falls. She does have a hospital bed. Mr. Voice reports patient having lost 17 pounds; she down to 123 in the past 3 months. Her appetite varies; he offers a variety of foods in attempt to get her to eat. She is receiving protein powder. Patient "sun-downs" each evening and becomes very talkative. Mr. Manwaring states she is only becomes agitated if she feels he is not paying attention. Mr. Groeber states patient's gait has become more shuffled and not able to walk for longer distances as before. He still takes her out to walk to mailbox and she enjoys dancing. Therapy had been ordered but was on hold due to coronavirus; Mr. Khoshaba does not want her to start therapy at this time. She is sleeping okay; family reports patient sleeping around 14-16 hours a day. No recent hospitalizations. No new medications.   CODE STATUS: DNR  PPS: 40% HOSPICE ELIGIBILITY/DIAGNOSIS: TBD  PAST MEDICAL HISTORY:  Past Medical History:  Diagnosis Date  . Alzheimer disease (HCC)   . History of TIAs   . Thyroid disease     SOCIAL HX:  Social History  Tobacco Use  . Smoking status: Former Smoker    Types: Cigarettes  . Smokeless tobacco: Never Used  Substance Use Topics  . Alcohol use: Yes    Comment: occas    ALLERGIES: No Known Allergies   PERTINENT MEDICATIONS:  Outpatient Encounter Medications as of 08/14/2018  Medication Sig  . atorvastatin (LIPITOR)  40 MG tablet Take 40 mg by mouth daily at 6 PM.  . azelastine (ASTELIN) 0.1 % nasal spray Place 1 spray into both nostrils 2 (two) times daily as needed for rhinitis or allergies.   . COMBIGAN 0.2-0.5 % ophthalmic solution Place 1 drop into both eyes 2 (two) times daily.   . Cyanocobalamin (B-12) 2500 MCG TABS Take 2,500 mcg by mouth daily.  Marland Kitchen. latanoprost (XALATAN) 0.005 % ophthalmic solution Place 1 drop into both eyes at bedtime.  Marland Kitchen. NAMENDA XR 28 MG CP24 24 hr capsule Take 28 mg by mouth daily.  . rivastigmine (EXELON) 9.5 mg/24hr Place 1 patch onto the skin daily.  Marland Kitchen. SYNTHROID 137 MCG tablet Take 137 mcg by mouth See admin instructions. 137mcg daily except for Sundays   No facility-administered encounter medications on file as of 08/14/2018.     PHYSICAL EXAM:   General: NAD, frail appearing, thin Cardiovascular: regular rate and rhythm Pulmonary: clear ant fields Abdomen: soft, nontender, + bowel sounds GU: no suprapubic tenderness Extremities: no edema, no joint deformities Skin: no rashes Neurological: Weakness but otherwise nonfocal  Luella CookLaToya S Jamicia Haaland, NP

## 2018-08-15 ENCOUNTER — Other Ambulatory Visit: Payer: Self-pay

## 2018-09-02 ENCOUNTER — Telehealth: Payer: Self-pay | Admitting: Student

## 2018-09-02 NOTE — Telephone Encounter (Signed)
Palliative NP left message for husband after receiving email regarding patient. Awaiting return call.

## 2018-09-03 ENCOUNTER — Telehealth: Payer: Self-pay | Admitting: Student

## 2018-09-03 NOTE — Telephone Encounter (Signed)
Palliative NP received return call from Mr. Murrill. He states it has been getting more challenging to get foods and fluids in patient. He states she is having a good day today. NP offered visit tomorrow; patient has another scheduled appointment; visit scheduled for Tuesday 09/09/2018 at 11am.

## 2018-09-09 ENCOUNTER — Other Ambulatory Visit: Payer: Medicare Other | Admitting: Student

## 2018-09-09 ENCOUNTER — Other Ambulatory Visit: Payer: Self-pay

## 2018-09-09 DIAGNOSIS — Z515 Encounter for palliative care: Secondary | ICD-10-CM

## 2018-09-09 NOTE — Progress Notes (Signed)
[facility] Consult Note Telephone: [phone]  Fax: [phone]  PATIENT NAME: Jocelyn Gilbert DOB: [birth year] MRN: [id number]  PRIMARY CARE PROVIDER:   Adin Hector, MD  REFERRING PROVIDER:  Adin Hector, MD [place], [place] [address]  RESPONSIBLE PARTY: Husband, Quin Hoop  ASSESSMENT: NP met with Jocelyn Gilbert, Jocelyn Gilbert and daughter Jocelyn Gilbert. [name] has expressive aphasia; she eats her breakfast during visit. She does smile during visit. She does not exhibit any signs of pain or discomfort. She is cooperative with assessment.  [name] would like for her to remain in the home and be treated with respect and dignity. We discussed her disease processes. [name] feels that recent "episodes" may be attributed to patient being dehydrated. We discussed her weight loss at length. We discussed offering variety of foods, fluids throughout the day. Continue nutritional supplements. [name] is willing to try medication to help with appetite; [name] notified with recommendation. We discussed Palliative versus Hospice. NP explained criteria guidelines for Hospice; [name] and [place] verbalize understanding. [name] does express caregiver fatigue. NP offered for Palliative SW to provide additional support as well; [name] states he will think about this.     RECOMMENDATIONS and PLAN:   1. Code status: DNR. 2. Medical goals of therapy: Palliative Medicine will focus on symptom management; will monitor for changes and declines and make recommendations as necessary.  3. Symptom management: Weight loss: Recommendation for mirtazapine 81m qhs; husband may benefit from speaking with dietician or nutritionist. Education provided on nutritional supplements, offering a variety of protein rich foods, increased calorie foods.  4. Discharge Planning: she will continue to  reside at home with husband, private caregivers 3 times a week. Recommend increasing frequency of outside caregivers to provide additional support.    Palliative Medicine will follow up in 4 weeks or sooner, if needed.   I spent  60 minutes providing this consultation,  from 11:00am to 12:00pm. More than 50% of the time in this consultation was spent coordinating communication.   HISTORY OF PRESENT ILLNESS:  MTaiyana Gilbert is a 84y.o.  female with multiple medical problems including mixed Alzheimer's and vascular dementia, hypothyroidism, hyperlipidemia, hx of TIA, Vitamin B12 deficiency, osteopenia, Vitamin D insufficiency, depression. Palliative Care was asked to help address goals of care; Jocelyn Gilbert seen for follow up visit today. Mr. SCasebeerreports patient doing okay but she is  moving slower. She is still ambulatory with hand held assist. She had a "fainting spell" when being walked to the bathroom in the past week and a half. Husband, daughter JMargarita Ranaassist with care as well as private caregivers [facility]. Patient sun-downs each evening; husband report her "not being aggravated, but she wants to touch everything."  Family reports patient did exhibit some anxiety recently when husband was in hospital and new caregivers were in home. Her appetite varies. Husband provides a variety of options for meals. She has been eating more softer foods. She drinks usually one nutritional supplement a day. Mr. SDoornreports her weight as 119 pounds. No recent emergency room visits. She has upcoming appointment with PCP.  CODE STATUS: DNR   PPS: 40% HOSPICE ELIGIBILITY/DIAGNOSIS: TBD  PAST MEDICAL HISTORY:  Past Medical History:  Diagnosis Date  . Alzheimer disease ([facility]   . History of TIAs   . Thyroid disease  SOCIAL HX:  Social History   Tobacco Use  . Smoking status: Former Smoker    Types: Cigarettes  . Smokeless tobacco: Never Used  Substance Use  Topics  . Alcohol use: Yes    Comment: occas    ALLERGIES: No Known Allergies   PERTINENT MEDICATIONS:  Outpatient Encounter Medications as of 09/09/2018  Medication Sig  . aspirin EC 81 MG tablet Take 81 mg by mouth daily.  Marland Kitchen atorvastatin (LIPITOR) 40 MG tablet Take 40 mg by mouth daily at 6 PM.  . azelastine (ASTELIN) 0.1 % nasal spray Place 1 spray into both nostrils 2 (two) times daily as needed for rhinitis or allergies.   . COMBIGAN 0.2-0.5 % ophthalmic solution Place 1 drop into both eyes 2 (two) times daily.   . Cyanocobalamin (B-12) 2500 MCG TABS Take 2,500 mcg by mouth daily.  Marland Kitchen latanoprost (XALATAN) 0.005 % ophthalmic solution Place 1 drop into both eyes at bedtime.  Marland Kitchen NAMENDA XR 28 MG CP24 24 hr capsule Take 28 mg by mouth daily.  . rivastigmine (EXELON) 9.5 mg/24hr Place 1 patch onto the skin daily.  Marland Kitchen SYNTHROID 137 MCG tablet Take 137 mcg by mouth See admin instructions. 163mg daily except for Sundays   No facility-administered encounter medications on file as of 09/09/2018.     PHYSICAL EXAM:   General: NAD, frail appearing, thin Cardiovascular: regular rate and rhythm Pulmonary: clear ant fields Abdomen: soft, nontender, + bowel sounds GU: no suprapubic tenderness Extremities: 1+ edema, no joint deformities Skin: no rashes Neurological: Weakness but otherwise nonfocal  LEzekiel Slocumb NP

## 2018-11-05 ENCOUNTER — Emergency Department: Payer: Medicare Other

## 2018-11-05 ENCOUNTER — Inpatient Hospital Stay
Admission: EM | Admit: 2018-11-05 | Discharge: 2018-11-07 | DRG: 871 | Disposition: A | Discharge status: Home / Self Care | Payer: Medicare Other | Attending: Internal Medicine | Admitting: Internal Medicine

## 2018-11-05 ENCOUNTER — Other Ambulatory Visit: Payer: Self-pay

## 2018-11-05 ENCOUNTER — Encounter: Payer: Self-pay | Admitting: *Deleted

## 2018-11-05 DIAGNOSIS — E039 Hypothyroidism, unspecified: Secondary | ICD-10-CM | POA: Diagnosis present

## 2018-11-05 DIAGNOSIS — N3000 Acute cystitis without hematuria: Secondary | ICD-10-CM

## 2018-11-05 DIAGNOSIS — Z79890 Hormone replacement therapy (postmenopausal): Secondary | ICD-10-CM

## 2018-11-05 DIAGNOSIS — Z8673 Personal history of transient ischemic attack (TIA), and cerebral infarction without residual deficits: Secondary | ICD-10-CM

## 2018-11-05 DIAGNOSIS — A419 Sepsis, unspecified organism: Secondary | ICD-10-CM | POA: Diagnosis not present

## 2018-11-05 DIAGNOSIS — G9341 Metabolic encephalopathy: Secondary | ICD-10-CM | POA: Diagnosis present

## 2018-11-05 DIAGNOSIS — E785 Hyperlipidemia, unspecified: Secondary | ICD-10-CM | POA: Diagnosis present

## 2018-11-05 DIAGNOSIS — Z79899 Other long term (current) drug therapy: Secondary | ICD-10-CM

## 2018-11-05 DIAGNOSIS — Z7982 Long term (current) use of aspirin: Secondary | ICD-10-CM

## 2018-11-05 DIAGNOSIS — H409 Unspecified glaucoma: Secondary | ICD-10-CM | POA: Diagnosis present

## 2018-11-05 DIAGNOSIS — Z9181 History of falling: Secondary | ICD-10-CM

## 2018-11-05 DIAGNOSIS — N390 Urinary tract infection, site not specified: Secondary | ICD-10-CM | POA: Diagnosis present

## 2018-11-05 DIAGNOSIS — Z87891 Personal history of nicotine dependence: Secondary | ICD-10-CM

## 2018-11-05 DIAGNOSIS — F0280 Dementia in other diseases classified elsewhere without behavioral disturbance: Secondary | ICD-10-CM | POA: Diagnosis present

## 2018-11-05 DIAGNOSIS — F329 Major depressive disorder, single episode, unspecified: Secondary | ICD-10-CM | POA: Diagnosis present

## 2018-11-05 DIAGNOSIS — G309 Alzheimer's disease, unspecified: Secondary | ICD-10-CM | POA: Diagnosis present

## 2018-11-05 DIAGNOSIS — Z20828 Contact with and (suspected) exposure to other viral communicable diseases: Secondary | ICD-10-CM | POA: Diagnosis present

## 2018-11-05 LAB — HEMOGLOBIN A1C
Hgb A1c MFr Bld: 5.1 % (ref 4.8–5.6)
Mean Plasma Glucose: 99.67 mg/dL

## 2018-11-05 LAB — URINALYSIS, COMPLETE (UACMP) WITH MICROSCOPIC
Bilirubin Urine: NEGATIVE
Glucose, UA: NEGATIVE mg/dL
Ketones, ur: NEGATIVE mg/dL
Nitrite: POSITIVE — AB
Protein, ur: NEGATIVE mg/dL
RBC / HPF: >50 RBC/hpf — ABNORMAL HIGH (ref 0–5)
Specific Gravity, Urine: 1.011 (ref 1.005–1.030)
pH: 5 (ref 5.0–8.0)

## 2018-11-05 LAB — COMPREHENSIVE METABOLIC PANEL
ALT: 15 U/L (ref 0–44)
AST: 21 U/L (ref 15–41)
Albumin: 3.4 g/dL — ABNORMAL LOW (ref 3.5–5.0)
Alkaline Phosphatase: 67 U/L (ref 38–126)
Anion gap: 5 (ref 5–15)
BUN: 9 mg/dL (ref 8–23)
CO2: 24 mmol/L (ref 22–32)
Calcium: 8.4 mg/dL — ABNORMAL LOW (ref 8.9–10.3)
Chloride: 114 mmol/L — ABNORMAL HIGH (ref 98–111)
Creatinine, Ser: 0.49 mg/dL (ref 0.44–1.00)
GFR calc Af Amer: >60 mL/min (ref >60)
GFR calc non Af Amer: >60 mL/min (ref >60)
Glucose, Bld: 126 mg/dL — ABNORMAL HIGH (ref 70–99)
Potassium: 3.8 mmol/L (ref 3.5–5.1)
Sodium: 143 mmol/L (ref 135–145)
Total Bilirubin: 1.1 mg/dL (ref 0.3–1.2)
Total Protein: 6.3 g/dL — ABNORMAL LOW (ref 6.5–8.1)

## 2018-11-05 LAB — CBC WITH DIFFERENTIAL/PLATELET
Abs Immature Granulocytes: 0.03 10*3/uL (ref 0.00–0.07)
Basophils Absolute: 0.1 10*3/uL (ref 0.0–0.1)
Basophils Relative: 1 %
Eosinophils Absolute: 0.2 10*3/uL (ref 0.0–0.5)
Eosinophils Relative: 2 %
HCT: 41.6 % (ref 36.0–46.0)
Hemoglobin: 13.9 g/dL (ref 12.0–15.0)
Immature Granulocytes: 0 %
Lymphocytes Relative: 23 %
Lymphs Abs: 2.1 10*3/uL (ref 0.7–4.0)
MCH: 30.2 pg (ref 26.0–34.0)
MCHC: 33.4 g/dL (ref 30.0–36.0)
MCV: 90.4 fL (ref 80.0–100.0)
Monocytes Absolute: 0.6 10*3/uL (ref 0.1–1.0)
Monocytes Relative: 6 %
Neutro Abs: 6 10*3/uL (ref 1.7–7.7)
Neutrophils Relative %: 68 %
Platelets: 191 10*3/uL (ref 150–400)
RBC: 4.6 MIL/uL (ref 3.87–5.11)
RDW: 12.8 % (ref 11.5–15.5)
WBC: 8.9 10*3/uL (ref 4.0–10.5)
nRBC: 0 % (ref 0.0–0.2)

## 2018-11-05 LAB — TSH: TSH: <0.01 u[IU]/mL — ABNORMAL LOW (ref 0.350–4.500)

## 2018-11-05 LAB — LACTIC ACID, PLASMA: Lactic Acid, Venous: 1.7 mmol/L (ref 0.5–1.9)

## 2018-11-05 LAB — PROTIME-INR
INR: 1 (ref 0.8–1.2)
Prothrombin Time: 12.7 seconds (ref 11.4–15.2)

## 2018-11-05 LAB — TROPONIN I (HIGH SENSITIVITY): Troponin I (High Sensitivity): 21 ng/L — ABNORMAL HIGH (ref <18)

## 2018-11-05 LAB — SARS CORONAVIRUS 2 BY RT PCR (HOSPITAL ORDER, PERFORMED IN ~~LOC~~ HOSPITAL LAB): SARS Coronavirus 2: NEGATIVE

## 2018-11-05 LAB — T4, FREE: Free T4: 1.67 ng/dL — ABNORMAL HIGH (ref 0.61–1.12)

## 2018-11-05 MED ORDER — ENOXAPARIN SODIUM 40 MG/0.4ML ~~LOC~~ SOLN
40.0000 mg | SUBCUTANEOUS | Status: DC
  Administered 2018-11-05: 22:00:00 40 mg via SUBCUTANEOUS
  Filled 2018-11-05 (×2): qty 0.4

## 2018-11-05 MED ORDER — ATORVASTATIN CALCIUM 20 MG PO TABS: 40.0000 mg | ORAL_TABLET | Freq: Every day | ORAL | Status: DC

## 2018-11-05 MED ORDER — SODIUM CHLORIDE 0.9 % IV BOLUS
1000.0000 mL | Freq: Once | INTRAVENOUS | Status: AC
  Administered 2018-11-05: 1000 mL via INTRAVENOUS

## 2018-11-05 MED ORDER — ACETAMINOPHEN 650 MG RE SUPP: 650.0000 mg | Freq: Four times a day (QID) | RECTAL | Status: DC | PRN

## 2018-11-05 MED ORDER — ASPIRIN EC 81 MG PO TBEC
81.0000 mg | DELAYED_RELEASE_TABLET | Freq: Every day | ORAL | Status: DC
  Administered 2018-11-05: 81 mg via ORAL
  Filled 2018-11-05 (×3): qty 1

## 2018-11-05 MED ORDER — BRIMONIDINE TARTRATE 0.2 % OP SOLN
1.0000 [drp] | Freq: Two times a day (BID) | OPHTHALMIC | Status: DC
  Administered 2018-11-05 (×2): 1 [drp] via OPHTHALMIC
  Filled 2018-11-05: qty 5

## 2018-11-05 MED ORDER — TIMOLOL MALEATE 0.5 % OP SOLN
1.0000 [drp] | Freq: Two times a day (BID) | OPHTHALMIC | Status: DC
  Administered 2018-11-05 – 2018-11-06 (×3): 1 [drp] via OPHTHALMIC
  Filled 2018-11-05: qty 5

## 2018-11-05 MED ORDER — VANCOMYCIN HCL 1.5 G IV SOLR
1500.0000 mg | Freq: Once | INTRAVENOUS | Status: AC
  Administered 2018-11-05: 1500 mg via INTRAVENOUS
  Filled 2018-11-05: qty 1500

## 2018-11-05 MED ORDER — SODIUM CHLORIDE 0.9 % IV SOLN: 1.0000 g | Freq: Every day | INTRAVENOUS | Status: DC

## 2018-11-05 MED ORDER — ONDANSETRON HCL 4 MG/2ML IJ SOLN: 4.0000 mg | Freq: Four times a day (QID) | INTRAMUSCULAR | Status: DC | PRN

## 2018-11-05 MED ORDER — ACETAMINOPHEN 325 MG PO TABS: 650.0000 mg | ORAL_TABLET | Freq: Four times a day (QID) | ORAL | Status: DC | PRN

## 2018-11-05 MED ORDER — ZINC SULFATE 220 (50 ZN) MG PO CAPS
220.0000 mg | ORAL_CAPSULE | Freq: Every day | ORAL | Status: DC
  Filled 2018-11-05: qty 1

## 2018-11-05 MED ORDER — VANCOMYCIN HCL IN DEXTROSE 1-5 GM/200ML-% IV SOLN: 1000.0000 mg | Freq: Once | INTRAVENOUS | Status: DC

## 2018-11-05 MED ORDER — HALOPERIDOL LACTATE 5 MG/ML IJ SOLN
2.0000 mg | Freq: Four times a day (QID) | INTRAMUSCULAR | Status: DC | PRN
  Administered 2018-11-06: 2 mg via INTRAVENOUS
  Filled 2018-11-05: qty 1

## 2018-11-05 MED ORDER — RIVASTIGMINE 9.5 MG/24HR TD PT24
9.5000 mg | MEDICATED_PATCH | Freq: Every day | TRANSDERMAL | Status: DC
  Administered 2018-11-05 – 2018-11-07 (×2): 9.5 mg via TRANSDERMAL
  Filled 2018-11-05 (×3): qty 1

## 2018-11-05 MED ORDER — POTASSIUM CHLORIDE IN NACL 20-0.9 MEQ/L-% IV SOLN
INTRAVENOUS | Status: DC
  Administered 2018-11-05 – 2018-11-07 (×4): via INTRAVENOUS
  Filled 2018-11-05 (×8): qty 1000

## 2018-11-05 MED ORDER — HALOPERIDOL LACTATE 5 MG/ML IJ SOLN
2.0000 mg | Freq: Once | INTRAMUSCULAR | Status: AC
  Administered 2018-11-05: 2 mg via INTRAVENOUS
  Filled 2018-11-05: qty 1

## 2018-11-05 MED ORDER — OLANZAPINE 2.5 MG PO TABS
2.5000 mg | ORAL_TABLET | Freq: Two times a day (BID) | ORAL | Status: DC
  Administered 2018-11-05: 22:00:00 2.5 mg via ORAL
  Filled 2018-11-05 (×5): qty 1

## 2018-11-05 MED ORDER — AZELASTINE HCL 0.1 % NA SOLN
1.0000 | Freq: Two times a day (BID) | NASAL | Status: DC | PRN
  Filled 2018-11-05: qty 30

## 2018-11-05 MED ORDER — ZINC SULFATE 220 (50 ZN) MG PO CAPS
220.0000 mg | ORAL_CAPSULE | Freq: Every day | ORAL | Status: DC
  Administered 2018-11-05: 220 mg via ORAL
  Filled 2018-11-05 (×3): qty 1

## 2018-11-05 MED ORDER — VITAMIN B-12 1000 MCG PO TABS
2500.0000 ug | ORAL_TABLET | Freq: Every day | ORAL | Status: DC
  Administered 2018-11-05: 12:00:00 2500 ug via ORAL
  Filled 2018-11-05 (×3): qty 3

## 2018-11-05 MED ORDER — DOCUSATE SODIUM 100 MG PO CAPS
100.0000 mg | ORAL_CAPSULE | Freq: Two times a day (BID) | ORAL | Status: DC
  Administered 2018-11-05 – 2018-11-06 (×3): 100 mg via ORAL
  Filled 2018-11-05 (×5): qty 1

## 2018-11-05 MED ORDER — SODIUM CHLORIDE 0.9 % IV SOLN
1.0000 g | Freq: Every day | INTRAVENOUS | Status: DC
  Administered 2018-11-05: 18:00:00 1 g via INTRAVENOUS
  Filled 2018-11-05: qty 10
  Filled 2018-11-05: qty 1

## 2018-11-05 MED ORDER — ONDANSETRON HCL 4 MG PO TABS: 4.0000 mg | ORAL_TABLET | Freq: Four times a day (QID) | ORAL | Status: DC | PRN

## 2018-11-05 MED ORDER — MEMANTINE HCL ER 28 MG PO CP24
28.0000 mg | ORAL_CAPSULE | Freq: Every day | ORAL | Status: DC
  Administered 2018-11-05: 12:00:00 28 mg via ORAL
  Filled 2018-11-05 (×3): qty 1

## 2018-11-05 MED ORDER — SODIUM CHLORIDE 0.9 % IV SOLN
2.0000 g | Freq: Once | INTRAVENOUS | Status: AC
  Administered 2018-11-05: 2 g via INTRAVENOUS
  Filled 2018-11-05: qty 2

## 2018-11-05 MED ORDER — MIRTAZAPINE 15 MG PO TABS
15.0000 mg | ORAL_TABLET | Freq: Every day | ORAL | Status: DC
  Administered 2018-11-05 – 2018-11-06 (×2): 15 mg via ORAL
  Filled 2018-11-05 (×2): qty 1

## 2018-11-05 MED ORDER — BRIMONIDINE TARTRATE-TIMOLOL 0.2-0.5 % OP SOLN
1.0000 [drp] | Freq: Two times a day (BID) | OPHTHALMIC | Status: DC
  Filled 2018-11-05: qty 5

## 2018-11-05 MED ORDER — METRONIDAZOLE IN NACL 5-0.79 MG/ML-% IV SOLN
500.0000 mg | Freq: Once | INTRAVENOUS | Status: AC
  Administered 2018-11-05: 500 mg via INTRAVENOUS
  Filled 2018-11-05: qty 100

## 2018-11-05 MED ORDER — LATANOPROST 0.005 % OP SOLN
1.0000 [drp] | Freq: Every day | OPHTHALMIC | Status: DC
  Administered 2018-11-05 – 2018-11-06 (×2): 1 [drp] via OPHTHALMIC
  Filled 2018-11-05: qty 2.5

## 2018-11-05 NOTE — Progress Notes (Signed)
Called and left a voicemail to her husband- on the phone.

## 2018-11-05 NOTE — ED Notes (Signed)
MD diamond at bedside

## 2018-11-05 NOTE — Progress Notes (Signed)
[facility] [place] NAME: Jocelyn Gilbert    MR#:  [id number]  DATE OF BIRTH:  Jan 28, 1935  SUBJECTIVE:  CHIEF COMPLAINT:   Chief Complaint  Patient presents with   Fall    REVIEW OF SYSTEMS:  Review of Systems  Unable to perform ROS: Dementia    DRUG ALLERGIES:  No Known Allergies  VITALS:  Blood pressure (!) 114/59, pulse 63, temperature 97.6 F (36.4 C), temperature source Oral, resp. rate 18, height 5\' 4"  (1.626 m), weight 56.7 kg, SpO2 96 %.  PHYSICAL EXAMINATION:  Physical Exam   GENERAL:  84 y.o.-year-old elderly patient lying in the bed with no acute distress.  EYES: Pupils equal, round, reactive to light and accommodation. No scleral icterus. Extraocular muscles intact.  HEENT: Head atraumatic, normocephalic. Oropharynx and nasopharynx clear.  NECK:  Supple, no jugular venous distention. No thyroid enlargement, no tenderness.  LUNGS: Normal breath sounds bilaterally, no wheezing, rales,rhonchi or crepitation. No use of accessory muscles of respiration.  Decreased bibasilar breath sounds CARDIOVASCULAR: S1, S2 normal. No  rubs, or gallops.  2/6 systolic murmur is ABDOMEN: Soft, nontender, nondistended. Bowel sounds present. No organomegaly or mass.  EXTREMITIES: No pedal edema, cyanosis, or clubbing.  NEUROLOGIC: Alert, able to move all extremities in bed.  Not following directions.  Appears very anxious.Marland Kitchen  PSYCHIATRIC: The patient is alert but disoriented SKIN: No obvious rash, lesion, or ulcer.    LABORATORY PANEL:   CBC Recent Labs  Lab 11/05/18 0505  WBC 8.9  HGB 13.9  HCT 41.6  PLT 191   ------------------------------------------------------------------------------------------------------------------  Chemistries  No results for input(s): NA, K, CL, CO2, GLUCOSE, BUN, CREATININE, CALCIUM, MG, AST, ALT, ALKPHOS, BILITOT in the last 168 hours.  Invalid input(s):  GFRCGP ------------------------------------------------------------------------------------------------------------------  Cardiac Enzymes No results for input(s): TROPONINI in the last 168 hours. ------------------------------------------------------------------------------------------------------------------  RADIOLOGY:  Ct Head Wo Contrast  Result Date: 11/05/2018 CLINICAL DATA:  Fall out of bed. Baseline dementia. Head trauma. Initial encounter. EXAM: CT HEAD WITHOUT CONTRAST CT CERVICAL SPINE WITHOUT CONTRAST TECHNIQUE: Multidetector CT imaging of the head and cervical spine was performed following the standard protocol without intravenous contrast. Multiplanar CT image reconstructions of the cervical spine were also generated. COMPARISON:  CT head without contrast 06/21/2018 FINDINGS: CT HEAD FINDINGS Brain: Advanced atrophy and diffuse white matter disease is stable. Basal ganglia are unchanged. The ventricles are proportionate to the degree of atrophy. No significant extraaxial fluid collection is present. Remote ischemic changes in the brainstem are stable. Cerebellum is unremarkable. Vascular: Atherosclerotic calcifications are present within the cavernous internal carotid arteries and at the dural margin of the vertebral arteries. There is no hyperdense vessel. Skull: Calvarium is intact. No focal lytic or blastic lesions are present. No significant extracranial soft tissue injury is present. Sinuses/Orbits: The paranasal sinuses and mastoid air cells are clear. The globes and orbits are within normal limits. CT CERVICAL SPINE FINDINGS Alignment: Grade 1 anterolisthesis at C4-5 is stable. AP alignment is otherwise anatomic. No significant curvature is present. Skull base and vertebrae: Craniocervical junction is normal. Advanced degenerative changes are again noted at C1-2. Vertebral body heights are maintained. No acute or healing fractures are present. Soft tissues and spinal canal: No  prevertebral fluid or swelling. No visible canal hematoma. Atherosclerotic calcifications are present at the carotid bifurcations bilaterally. Disc levels: Uncovertebral and facet disease is again noted at multiple levels, most prominent at C4-5, C5-6, and C6-7. Osseous foraminal narrowing is present bilaterally  at these levels. Upper chest: Lung apices are clear.  Thoracic inlet is normal. IMPRESSION: 1. No acute intracranial abnormality. 2. Stable advanced atrophy and diffuse white matter disease. This likely reflects the sequela of chronic microvascular ischemia. 3. No acute trauma to the head or cervical spine. 4. Stable multilevel degenerative changes in the cervical spine. 5. Atherosclerosis. Electronically Signed   By: Marin Robertshristopher  Mattern M.D.   On: 11/05/2018 07:05   Ct Cervical Spine Wo Contrast  Result Date: 11/05/2018 CLINICAL DATA:  Fall out of bed. Baseline dementia. Head trauma. Initial encounter. EXAM: CT HEAD WITHOUT CONTRAST CT CERVICAL SPINE WITHOUT CONTRAST TECHNIQUE: Multidetector CT imaging of the head and cervical spine was performed following the standard protocol without intravenous contrast. Multiplanar CT image reconstructions of the cervical spine were also generated. COMPARISON:  CT head without contrast 06/21/2018 FINDINGS: CT HEAD FINDINGS Brain: Advanced atrophy and diffuse white matter disease is stable. Basal ganglia are unchanged. The ventricles are proportionate to the degree of atrophy. No significant extraaxial fluid collection is present. Remote ischemic changes in the brainstem are stable. Cerebellum is unremarkable. Vascular: Atherosclerotic calcifications are present within the cavernous internal carotid arteries and at the dural margin of the vertebral arteries. There is no hyperdense vessel. Skull: Calvarium is intact. No focal lytic or blastic lesions are present. No significant extracranial soft tissue injury is present. Sinuses/Orbits: The paranasal sinuses and  mastoid air cells are clear. The globes and orbits are within normal limits. CT CERVICAL SPINE FINDINGS Alignment: Grade 1 anterolisthesis at C4-5 is stable. AP alignment is otherwise anatomic. No significant curvature is present. Skull base and vertebrae: Craniocervical junction is normal. Advanced degenerative changes are again noted at C1-2. Vertebral body heights are maintained. No acute or healing fractures are present. Soft tissues and spinal canal: No prevertebral fluid or swelling. No visible canal hematoma. Atherosclerotic calcifications are present at the carotid bifurcations bilaterally. Disc levels: Uncovertebral and facet disease is again noted at multiple levels, most prominent at C4-5, C5-6, and C6-7. Osseous foraminal narrowing is present bilaterally at these levels. Upper chest: Lung apices are clear.  Thoracic inlet is normal. IMPRESSION: 1. No acute intracranial abnormality. 2. Stable advanced atrophy and diffuse white matter disease. This likely reflects the sequela of chronic microvascular ischemia. 3. No acute trauma to the head or cervical spine. 4. Stable multilevel degenerative changes in the cervical spine. 5. Atherosclerosis. Electronically Signed   By: Marin Robertshristopher  Mattern M.D.   On: 11/05/2018 07:05   Dg Chest Port 1 View  Result Date: 11/05/2018 CLINICAL DATA:  Hypothermia.  Confusion. EXAM: PORTABLE CHEST 1 VIEW COMPARISON:  08/07/2016 FINDINGS: Patient is rotated to the left the heart size and mediastinal contours are within normal limits allowing for positioning. Aortic atherosclerosis. Both lungs are clear. IMPRESSION: No active disease. Electronically Signed   By: Danae OrleansJohn A Stahl M.D.   On: 11/05/2018 05:44    EKG:   Orders placed or performed during the hospital encounter of 11/05/18   ED EKG 12-Lead   ED EKG 12-Lead   EKG 12-Lead   EKG 12-Lead    ASSESSMENT AND PLAN:   84 year old female with past medical history significant for hypothyroidism, severe  Alzheimer's dementia was brought in from home secondary to a fall and confusion  1.  Acute encephalopathy-on top of dementia, fell out of bed last night. -Likely secondary to acute metabolic encephalopathy from UTI -Follow-up urine cultures and blood cultures -Currently on IV antibiotics with IV Rocephin -Physical therapy consulted  2.  Hypothyroidism-hold Synthroid as TSH is low, check free T4  3.  Alzheimer's dementia-very disoriented, alert at baseline.  Continue home medications.  Patient on Namenda, Exelon  4.  Depression-continue Remeron  5.  Glaucoma-continue eyedrops  6.  DVT prophylaxis-Lovenox  Physical therapy consult     All the records are reviewed and case discussed with Care Management/Social Workerr. Management plans discussed with the patient, family and they are in agreement.  CODE STATUS: Full code  TOTAL TIME TAKING CARE OF THIS PATIENT: 39 minutes.   POSSIBLE D/C IN 2 DAYS, DEPENDING ON CLINICAL CONDITION.   Enid Baasadhika Bevan Vu M.D on 11/05/2018 at 10:47 AM  Between 7am to 6pm - Pager - [phone]  After 6pm go to [email or web address] - password [email or web address]  [facility]  Office  [phone]  CC: Primary care physician; Lynnea FerrierKlein, Bert J III, MD

## 2018-11-05 NOTE — Progress Notes (Signed)
PT Cancellation Note  Patient Details Name: Jocelyn Gilbert MRN: [id number] DOB: Nov 09, 1934   Cancelled Treatment:    Reason Eval/Treat Not Completed: Other (comment)(PT entered room, RN at bedside preparing to administer haldol. Pt very restless in bed, removing blankets and gown repetitively. Pt unable to state name, intermittently followed simple commands. Per RN and pt presentation, PT to hold evaluation). PT will follow up as able.    Lieutenant Diego PT, DPT 11:44 AM,11/05/18 [phone]

## 2018-11-05 NOTE — Progress Notes (Signed)
CODE SEPSIS - PHARMACY COMMUNICATION  **Broad Spectrum Antibiotics should be administered within 1 hour of Sepsis diagnosis**  Time Code Sepsis Called/Page Received: 8338  Antibiotics Ordered: vanc/cefepime/flagyl  Time of 1st antibiotic administration: 0548  Additional action taken by pharmacy:   If necessary, Name of Provider/Nurse Contacted:     Tobie Lords ,PharmD Clinical Pharmacist  11/05/2018  6:01 AM

## 2018-11-05 NOTE — ED Notes (Signed)
Informed Caryl Pina, RN of rectal temp

## 2018-11-05 NOTE — Progress Notes (Signed)
PHARMACY -  BRIEF ANTIBIOTIC NOTE   Pharmacy has received consult(s) for vancomycin/cefepime from an ED provider.  The patient's profile has been reviewed for ht/wt/allergies/indication/available labs.    One time order(s) placed for Vanc 1.5g IV load and cefepime 2g IV x 1  Further antibiotics/pharmacy consults should be ordered by admitting physician if indicated.                       Thank you,  Tobie Lords, PharmD, BCPS Clinical Pharmacist 11/05/2018  5:25 AM

## 2018-11-05 NOTE — ED Provider Notes (Addendum)
[name] Provider Note __   First MD Initiated Contact with Patient 11/05/18 [phone]     (approximate)  I have reviewed the triage vital signs and the nursing notes.  Level 5 caveat history review of system and physical exam limited secondary to dementia. HISTORY  Chief Complaint Fall    HPI Jocelyn Gilbert is a 84 y.o. female with below list of previous medical conditions presents to the emergency department EMS with history of falling out of bed tonight.  Per EMS patient's husband states that the patient is "not acting as normal".  Patient denies any complaints at present.  On arrival patient's temperature noted to be 95.1 heart rate 52.      Past Medical History:  Diagnosis Date   Alzheimer disease (HCC)    History of TIAs    Thyroid disease     Patient Active Problem List   Diagnosis Date Noted   TIA (transient ischemic attack) 08/07/2016    Past Surgical History:  Procedure Laterality Date   EYE SURGERY      Prior to Admission medications   Medication Sig Start Date End Date Taking? Authorizing Provider  aspirin EC 81 MG tablet Take 81 mg by mouth daily.    [provider]  atorvastatin (LIPITOR) 40 MG tablet Take 40 mg by mouth daily at 6 PM. 06/16/18   [provider]  azelastine (ASTELIN) 0.1 % nasal spray Place 1 spray into both nostrils 2 (two) times daily as needed for rhinitis or allergies.  03/12/18   [provider]  COMBIGAN 0.2-0.5 % ophthalmic solution Place 1 drop into both eyes 2 (two) times daily.  06/26/16   [provider]  Cyanocobalamin (B-12) 2500 MCG TABS Take 2,500 mcg by mouth daily.    [provider]  latanoprost (XALATAN) 0.005 % ophthalmic solution Place 1 drop into both eyes at bedtime. 07/13/16   [provider]  NAMENDA XR 28 MG CP24 24 hr capsule Take 28 mg by mouth daily. 06/19/16   [provider]  rivastigmine (EXELON) 9.5  mg/24hr Place 1 patch onto the skin daily. 06/26/16   [provider]  SYNTHROID 137 MCG tablet Take 137 mcg by mouth See admin instructions. 137mcg daily except for Sundays 06/27/16   [provider]  Vitamin D, Ergocalciferol, 50 MCG (2000 UT) CAPS Take 2 capsules by mouth.    [provider]    Allergies Patient has no known allergies.  No family history on file.  Social History Social History   Tobacco Use   Smoking status: Former Smoker    Types: Cigarettes   Smokeless tobacco: Never Used  Substance Use Topics   Alcohol use: Yes    Comment: occas   Drug use: No    Review of Systems Constitutional: No fever/chills Eyes: No visual changes. ENT: No sore throat. Cardiovascular: Denies chest pain. Respiratory: Denies shortness of breath. Gastrointestinal: No abdominal pain.  No nausea, no vomiting.  No diarrhea.  No constipation. Genitourinary: Negative for dysuria. Musculoskeletal: Negative for neck pain.  Negative for back pain. Integumentary: Negative for rash. Neurological: Negative for headaches, focal weakness or numbness.  ____________________________________________   PHYSICAL EXAM:  VITAL SIGNS: ED Triage Vitals  Enc Vitals Group     BP 11/05/18 0456 (!) 121/59     Pulse Rate 11/05/18 0456 (!) 52     Resp 11/05/18 0456 17     Temp 11/05/18 0456 (!) 95.1 F (35.1  C)     Temp Source 11/05/18 0456 Rectal     SpO2 11/05/18 0456 98 %     Weight 11/05/18 0459 56.7 kg (125 lb)     Height 11/05/18 0458 1.575 m (5\' 2" )     Head Circumference --      Peak Flow --      Pain Score --      Pain Loc --      Pain Edu? --      Excl. in [place]? --     Constitutional: Alert Eyes: Conjunctivae are normal.  Head: Atraumatic. Ears:  Healthy appearing ear canals and TMs bilaterally.  Abrasion to the right ear Nose: No congestion/rhinnorhea. Mouth/Throat: Mucous membranes are moist.  Oropharynx non-erythematous. Neck: No stridor.  No  cervical spine tenderness to palpation. Cardiovascular: Normal rate, regular rhythm. Good peripheral circulation. Grossly normal heart sounds. Respiratory: Normal respiratory effort.  No retractions. No audible wheezing. Gastrointestinal: Soft and nontender. No distention.  Musculoskeletal: No lower extremity tenderness nor edema. No gross deformities of extremities. Neurologic:  Normal speech and language. No gross focal neurologic deficits are appreciated.  Skin:  Skin is warm, dry and intact. No rash noted. Psychiatric: Mood and affect are normal. Speech and behavior are normal.  ____________________________________________   LABS (all labs ordered are listed, but only abnormal results are displayed)  Labs Reviewed  URINALYSIS, COMPLETE (UACMP) WITH MICROSCOPIC - Abnormal; Notable for the following components:      Result Value   Color, Urine YELLOW (*)    APPearance HAZY (*)    Hgb urine dipstick LARGE (*)    Nitrite POSITIVE (*)    Leukocytes,Ua MODERATE (*)    RBC / HPF >50 (*)    Bacteria, UA MANY (*)    All other components within normal limits  SARS CORONAVIRUS 2 (HOSPITAL ORDER, [facility])  CULTURE, BLOOD (ROUTINE X 2)  CULTURE, BLOOD (ROUTINE X 2)  URINE CULTURE  LACTIC ACID, PLASMA  PROTIME-INR  CBC WITH DIFFERENTIAL/PLATELET  COMPREHENSIVE METABOLIC PANEL  TSH  TROPONIN I (HIGH SENSITIVITY)   ____________________________________________  EKG  ED ECG REPORT I, [place] Geraldine Tesar, the attending physician, personally viewed and interpreted this ECG.   Date: 11/05/2018  EKG Time: 4:43 AM  Rate: 55  Rhythm: Normal sinus rhythm  Axis: Normal  Intervals: Normal  ST&T Change: None  ____________________________________________  RADIOLOGY I, [place] Bakari Nikolai, personally viewed and evaluated these images (plain radiographs) as part of my medical decision making, as well as reviewing the written report by the radiologist.  ED MD  interpretation:  No active disease  Official radiology report(s): Dg Chest Port 1 View  Result Date: 11/05/2018 CLINICAL DATA:  Hypothermia.  Confusion. EXAM: PORTABLE CHEST 1 VIEW COMPARISON:  08/07/2016 FINDINGS: Patient is rotated to the left the heart size and mediastinal contours are within normal limits allowing for positioning. Aortic atherosclerosis. Both lungs are clear. IMPRESSION: No active disease. Electronically Signed   By: Marlaine Hind M.D.   On: 11/05/2018 05:44     .Critical Care Performed by: Gregor Hams, MD Authorized by: Gregor Hams, MD   Critical care provider statement:    Critical care time (minutes):  30   Critical care time was exclusive of:  Separately billable procedures and treating other patients   Critical care was necessary to treat or prevent imminent or life-threatening deterioration of the following conditions:  Sepsis   Critical care was time spent personally by me  on the following activities:  Development of treatment plan with patient or surrogate, discussions with consultants, evaluation of patient's response to treatment, examination of patient, obtaining history from patient or surrogate, ordering and performing treatments and interventions, ordering and review of laboratory studies, ordering and review of radiographic studies, pulse oximetry, re-evaluation of patient's condition and review of old charts   I assumed direction of critical care for this patient from another provider in my specialty: no       ____________________________________________   INITIAL IMPRESSION / MDM / ASSESSMENT AND PLAN / ED COURSE  As part of my medical decision making, I reviewed the following data within the electronic MEDICAL RECORD NUMBER   84 year old female presented with above-stated history and physical exam secondary to increasing altered mental status and hypothermia as such concern for possible sepsis.  Sepsis protocol initiated with broad-spectrum  IV antibiotics administered.  Patient's urinalysis consistent with a UTI.  Patient discussed with Dr. Sheryle Haildiamond for hospital admission further evaluation and management. ____________________________________________  FINAL CLINICAL IMPRESSION(S) / ED DIAGNOSES  Final diagnoses:  Acute cystitis without hematuria  Sepsis, due to unspecified organism, unspecified whether acute organ dysfunction present [facility](HCC)     MEDICATIONS GIVEN DURING THIS VISIT:  Medications  metroNIDAZOLE (FLAGYL) IVPB 500 mg (500 mg Intravenous New Bag/Given 11/05/18 0613)  vancomycin (VANCOCIN) 1,500 mg in sodium chloride 0.9 % 500 mL IVPB (1,500 mg Intravenous New Bag/Given 11/05/18 0619)  ceFEPIme (MAXIPIME) 2 g in sodium chloride 0.9 % 100 mL IVPB (0 g Intravenous Stopped 11/05/18 0622)     ED Discharge Orders    None      *Please note:  Jocelyn Gilbert was evaluated in Emergency Department on 11/05/2018 for the symptoms described in the history of present illness. She was evaluated in the context of the global COVID-19 pandemic, which necessitated consideration that the patient might be at risk for infection with the SARS-CoV-2 virus that causes COVID-19. Institutional protocols and algorithms that pertain to the evaluation of patients at risk for COVID-19 are in a state of rapid change based on information released by regulatory bodies including the CDC and federal and state organizations. These policies and algorithms were followed during the patient's care in the ED.  Some ED evaluations and interventions may be delayed as a result of limited staffing during the pandemic.*  Note:  This document was prepared using Dragon voice recognition software and may include unintentional dictation errors.   Darci CurrentBrown, [place] N, MD 11/05/18 [id number]    Darci CurrentBrown, [place] N, MD 11/11/18 [phone]

## 2018-11-05 NOTE — H&P (Signed)
Jocelyn Gilbert is an 84 y.o. female.   Chief Complaint: Fall HPI: The patient with past medical history of Alzheimer's dementia and hypothyroidism presents to the emergency department due to a fall.  The patient's husband reports that she has been weak for a few days.  She is also less responsive.  Her husband reports that she has had 2 episodes of loose stools as well. The patient was found to have a core temperature of 95 F upon arrival.  Sepsis protocol was initiated and blood cultures were obtained prior to initiation of broad-spectrum antibiotics.  Urinalysis was consistent with infection.  Once the patient was stabilized emergency department staff call hospitalist service for admission.  Past Medical History:  Diagnosis Date  . Alzheimer disease (HCC)   . History of TIAs   . Thyroid disease     Past Surgical History:  Procedure Laterality Date  . EYE SURGERY      No family history on file. Patient cannot contribute to her history  Social History:  reports that she has quit smoking. Her smoking use included cigarettes. She has never used smokeless tobacco. She reports current alcohol use. She reports that she does not use drugs.  Allergies: No Known Allergies  Prior to Admission medications   Medication Sig Start Date End Date Taking? Authorizing Provider  acetaminophen (TYLENOL) 325 MG tablet Take 325 mg by mouth every 6 (six) hours as needed for headache.   Yes [provider]  Ascorbic Acid (VITAMIN C) POWD Take 500 mg by mouth daily.   Yes [provider]  atorvastatin (LIPITOR) 40 MG tablet Take 40 mg by mouth daily at 6 PM. 06/16/18  Yes [provider]  azelastine (ASTELIN) 0.1 % nasal spray Place 1 spray into both nostrils 2 (two) times daily as needed for rhinitis or allergies.  03/12/18  Yes [provider]  COMBIGAN 0.2-0.5 % ophthalmic solution Place 1 drop into both eyes 2 (two) times daily.  06/26/16  Yes [provider]   Cyanocobalamin (B-12) 2500 MCG TABS Take 2,500 mcg by mouth daily.   Yes [provider]  latanoprost (XALATAN) 0.005 % ophthalmic solution Place 1 drop into both eyes at bedtime. 07/13/16  Yes [provider]  mirtazapine (REMERON) 15 MG tablet Take 15 mg by mouth at bedtime. 10/18/18  Yes [provider]  NAMENDA XR 28 MG CP24 24 hr capsule Take 28 mg by mouth daily. 06/19/16  Yes [provider]  rivastigmine (EXELON) 9.5 mg/24hr Place 1 patch onto the skin daily. 06/26/16  Yes [provider]  SYNTHROID 112 MCG tablet Take 112 mcg by mouth See admin instructions. 112 mcg daily except for Sundays 06/27/16  Yes [provider]  Vitamin D, Ergocalciferol, 50 MCG (2000 UT) CAPS Take 2 capsules by mouth.   Yes [provider]  zinc gluconate 50 MG tablet Take 50 mg by mouth daily.   Yes [provider]  aspirin EC 81 MG tablet Take 81 mg by mouth daily.    [provider]     Results for orders placed or performed during the hospital encounter of 11/05/18 (from the past 48 hour(s))  Urinalysis, Complete w Microscopic     Status: Abnormal   Collection Time: 11/05/18  5:05 AM  Result Value Ref Range   Color, Urine YELLOW (A) YELLOW   APPearance HAZY (A) CLEAR   Specific Gravity, Urine 1.011 1.005 - 1.030   pH 5.0 5.0 - 8.0  Glucose, UA NEGATIVE NEGATIVE mg/dL   Hgb urine dipstick LARGE (A) NEGATIVE   Bilirubin Urine NEGATIVE NEGATIVE   Ketones, ur NEGATIVE NEGATIVE mg/dL   Protein, ur NEGATIVE NEGATIVE mg/dL   Nitrite POSITIVE (A) NEGATIVE   Leukocytes,Ua MODERATE (A) NEGATIVE   RBC / HPF >50 (H) 0 - 5 RBC/hpf   WBC, UA 21-50 0 - 5 WBC/hpf   Bacteria, UA MANY (A) NONE SEEN   Squamous Epithelial / LPF 0-5 0 - 5   WBC Clumps PRESENT     Comment: Performed at [facility], [address] [place], [place] [address]  SARS Coronavirus 2 (CEPHEID - Performed in [facility]), [facility]      Status: None   Collection Time: 11/05/18  5:05 AM   Specimen: Nasopharyngeal Swab  Result Value Ref Range   SARS Coronavirus 2 NEGATIVE NEGATIVE    Comment: (NOTE) If result is NEGATIVE SARS-CoV-2 target nucleic acids are NOT DETECTED. The SARS-CoV-2 RNA is generally detectable in upper and lower  respiratory specimens during the acute phase of infection. The lowest  concentration of SARS-CoV-2 viral copies this assay can detect is 250  copies / mL. A negative result does not preclude SARS-CoV-2 infection  and should not be used as the sole basis for treatment or other  patient management decisions.  A negative result may occur with  improper specimen collection / handling, submission of specimen other  than nasopharyngeal swab, presence of viral mutation(s) within the  areas targeted by this assay, and inadequate number of viral copies  (<250 copies / mL). A negative result must be combined with clinical  observations, patient history, and epidemiological information. If result is POSITIVE SARS-CoV-2 target nucleic acids are DETECTED. The SARS-CoV-2 RNA is generally detectable in upper and lower  respiratory specimens dur ing the acute phase of infection.  Positive  results are indicative of active infection with SARS-CoV-2.  Clinical  correlation with patient history and other diagnostic information is  necessary to determine patient infection status.  Positive results do  not rule out bacterial infection or co-infection with other viruses. If result is PRESUMPTIVE POSTIVE SARS-CoV-2 nucleic acids MAY BE PRESENT.   A presumptive positive result was obtained on the submitted specimen  and confirmed on repeat testing.  While 2019 novel coronavirus  (SARS-CoV-2) nucleic acids may be present in the submitted sample  additional confirmatory testing may be necessary for epidemiological  and / or clinical management purposes  to differentiate between  SARS-CoV-2 and other Sarbecovirus  currently known to infect humans.  If clinically indicated additional testing with an alternate test  methodology [phone] is advised. The SARS-CoV-2 RNA is generally  detectable in upper and lower respiratory sp ecimens during the acute  phase of infection. The expected result is Negative. Fact Sheet for Patients:  BoilerBrush.com.cyhttps://www.fda.gov/media/136312/download Fact Sheet for Healthcare Providers: https://pope.com/https://www.fda.gov/media/136313/download This test is not yet approved or cleared by the [place] FDA and has been authorized for detection and/or diagnosis of SARS-CoV-2 by FDA under an Emergency Use Authorization (EUA).  This EUA will remain in effect (meaning this test can be used) for the duration of the COVID-19 declaration under Section 564(b)(1) of the Act, 21 U.S.C. section 360bbb-3(b)(1), unless the authorization is terminated or revoked sooner. Performed at [facility], [address] [place], [place] [address]   Lactic acid, plasma     Status: None   Collection Time: 11/05/18  5:05 AM  Result Value Ref Range  Lactic Acid, Venous 1.7 0.5 - 1.9 mmol/L    Comment: Performed at [facility], [address] [place], [place] [address]  Protime-INR     Status: None   Collection Time: 11/05/18  5:05 AM  Result Value Ref Range   Prothrombin Time 12.7 11.4 - 15.2 seconds   INR 1.0 0.8 - 1.2    Comment: (NOTE) INR goal varies based on device and disease states. Performed at [facility], [address] [place], [place] [address]   CBC WITH DIFFERENTIAL     Status: None   Collection Time: 11/05/18  5:05 AM  Result Value Ref Range   WBC 8.9 4.0 - 10.5 K/uL   RBC 4.60 3.87 - 5.11 MIL/uL   Hemoglobin 13.9 12.0 - 15.0 g/dL   HCT 82.941.6 56.236.0 - 13.046.0 %   MCV 90.4 80.0 - 100.0 fL   MCH 30.2 26.0 - 34.0 pg   MCHC 33.4 30.0 - 36.0 g/dL   RDW 86.512.8 78.411.5 - 69.615.5 %   Platelets 191 150 - 400 K/uL   nRBC 0.0 0.0 - 0.2 %   Neutrophils Relative % 68 %    Neutro Abs 6.0 1.7 - 7.7 K/uL   Lymphocytes Relative 23 %   Lymphs Abs 2.1 0.7 - 4.0 K/uL   Monocytes Relative 6 %   Monocytes Absolute 0.6 0.1 - 1.0 K/uL   Eosinophils Relative 2 %   Eosinophils Absolute 0.2 0.0 - 0.5 K/uL   Basophils Relative 1 %   Basophils Absolute 0.1 0.0 - 0.1 K/uL   Immature Granulocytes 0 %   Abs Immature Granulocytes 0.03 0.00 - 0.07 K/uL    Comment: Performed at [facility], [address] [place], [place] [address]  TSH     Status: Abnormal   Collection Time: 11/05/18  5:05 AM  Result Value Ref Range   TSH <0.010 (L) 0.350 - 4.500 uIU/mL    Comment: Performed by a 3rd Generation assay with a functional sensitivity of <=0.01 uIU/mL. Performed at [facility], [address] [place], [place] [address]    Ct Head Wo Contrast  Result Date: 11/05/2018 CLINICAL DATA:  Fall out of bed. Baseline dementia. Head trauma. Initial encounter. EXAM: CT HEAD WITHOUT CONTRAST CT CERVICAL SPINE WITHOUT CONTRAST TECHNIQUE: Multidetector CT imaging of the head and cervical spine was performed following the standard protocol without intravenous contrast. Multiplanar CT image reconstructions of the cervical spine were also generated. COMPARISON:  CT head without contrast 06/21/2018 FINDINGS: CT HEAD FINDINGS Brain: Advanced atrophy and diffuse white matter disease is stable. Basal ganglia are unchanged. The ventricles are proportionate to the degree of atrophy. No significant extraaxial fluid collection is present. Remote ischemic changes in the brainstem are stable. Cerebellum is unremarkable. Vascular: Atherosclerotic calcifications are present within the cavernous internal carotid arteries and at the dural margin of the vertebral arteries. There is no hyperdense vessel. Skull: Calvarium is intact. No focal lytic or blastic lesions are present. No significant extracranial soft tissue injury is present. Sinuses/Orbits: The paranasal sinuses and mastoid air cells  are clear. The globes and orbits are within normal limits. CT CERVICAL SPINE FINDINGS Alignment: Grade 1 anterolisthesis at C4-5 is stable. AP alignment is otherwise anatomic. No significant curvature is present. Skull base and vertebrae: Craniocervical junction is normal. Advanced degenerative changes are again noted at C1-2. Vertebral body heights are maintained. No acute or healing fractures are present. Soft tissues and spinal canal: No prevertebral fluid or swelling. No  visible canal hematoma. Atherosclerotic calcifications are present at the carotid bifurcations bilaterally. Disc levels: Uncovertebral and facet disease is again noted at multiple levels, most prominent at C4-5, C5-6, and C6-7. Osseous foraminal narrowing is present bilaterally at these levels. Upper chest: Lung apices are clear.  Thoracic inlet is normal. IMPRESSION: 1. No acute intracranial abnormality. 2. Stable advanced atrophy and diffuse white matter disease. This likely reflects the sequela of chronic microvascular ischemia. 3. No acute trauma to the head or cervical spine. 4. Stable multilevel degenerative changes in the cervical spine. 5. Atherosclerosis. Electronically Signed   By: San Morelle M.D.   On: 11/05/2018 07:05   Ct Cervical Spine Wo Contrast  Result Date: 11/05/2018 CLINICAL DATA:  Fall out of bed. Baseline dementia. Head trauma. Initial encounter. EXAM: CT HEAD WITHOUT CONTRAST CT CERVICAL SPINE WITHOUT CONTRAST TECHNIQUE: Multidetector CT imaging of the head and cervical spine was performed following the standard protocol without intravenous contrast. Multiplanar CT image reconstructions of the cervical spine were also generated. COMPARISON:  CT head without contrast 06/21/2018 FINDINGS: CT HEAD FINDINGS Brain: Advanced atrophy and diffuse white matter disease is stable. Basal ganglia are unchanged. The ventricles are proportionate to the degree of atrophy. No significant extraaxial fluid collection is  present. Remote ischemic changes in the brainstem are stable. Cerebellum is unremarkable. Vascular: Atherosclerotic calcifications are present within the cavernous internal carotid arteries and at the dural margin of the vertebral arteries. There is no hyperdense vessel. Skull: Calvarium is intact. No focal lytic or blastic lesions are present. No significant extracranial soft tissue injury is present. Sinuses/Orbits: The paranasal sinuses and mastoid air cells are clear. The globes and orbits are within normal limits. CT CERVICAL SPINE FINDINGS Alignment: Grade 1 anterolisthesis at C4-5 is stable. AP alignment is otherwise anatomic. No significant curvature is present. Skull base and vertebrae: Craniocervical junction is normal. Advanced degenerative changes are again noted at C1-2. Vertebral body heights are maintained. No acute or healing fractures are present. Soft tissues and spinal canal: No prevertebral fluid or swelling. No visible canal hematoma. Atherosclerotic calcifications are present at the carotid bifurcations bilaterally. Disc levels: Uncovertebral and facet disease is again noted at multiple levels, most prominent at C4-5, C5-6, and C6-7. Osseous foraminal narrowing is present bilaterally at these levels. Upper chest: Lung apices are clear.  Thoracic inlet is normal. IMPRESSION: 1. No acute intracranial abnormality. 2. Stable advanced atrophy and diffuse white matter disease. This likely reflects the sequela of chronic microvascular ischemia. 3. No acute trauma to the head or cervical spine. 4. Stable multilevel degenerative changes in the cervical spine. 5. Atherosclerosis. Electronically Signed   By: San Morelle M.D.   On: 11/05/2018 07:05   Dg Chest Port 1 View  Result Date: 11/05/2018 CLINICAL DATA:  Hypothermia.  Confusion. EXAM: PORTABLE CHEST 1 VIEW COMPARISON:  08/07/2016 FINDINGS: Patient is rotated to the left the heart size and mediastinal contours are within normal limits  allowing for positioning. Aortic atherosclerosis. Both lungs are clear. IMPRESSION: No active disease. Electronically Signed   By: Marlaine Hind M.D.   On: 11/05/2018 05:44    Review of Systems  Unable to perform ROS: Mental status change    Blood pressure (!) 121/59, pulse (!) 52, temperature (!) 95.1 F (35.1 C), temperature source Rectal, resp. rate 17, height 5\' 4"  (1.626 m), weight 56.7 kg, SpO2 98 %. Physical Exam  Vitals reviewed. Constitutional: She appears well-developed and well-nourished. She appears lethargic. No distress.  HENT:  Head: Normocephalic and  atraumatic.  Mouth/Throat: Oropharynx is clear and moist.  Eyes: Pupils are equal, round, and reactive to light. Conjunctivae and EOM are normal. No scleral icterus.  Neck: Normal range of motion. Neck supple. No JVD present. No tracheal deviation present. No thyromegaly present.  Cardiovascular: Normal rate, regular rhythm and normal heart sounds. Exam reveals no gallop and no friction rub.  No murmur heard. Respiratory: Effort normal and breath sounds normal.  GI: Soft. Bowel sounds are normal. She exhibits no distension. There is no abdominal tenderness.  Genitourinary:    Genitourinary Comments: Deferred   Musculoskeletal: Normal range of motion.        General: No edema.  Lymphadenopathy:    She has no cervical adenopathy.  Neurological: She appears lethargic. No cranial nerve deficit. She exhibits normal muscle tone.  Skin: Skin is warm and dry. No rash noted. No erythema.  Psychiatric:  Difficult to assess due to mental status change     Assessment/Plan This is an 84 year old female admitted for urinary tract infection. 1.  UTI: Present on admission.  The patient has been covered with cefepime, metronidazole and vancomycin in the emergency department due to concern for sepsis.  Infectious source is urine but the patient does not meet complete criteria for sepsis.  She is hemodynamically stable.  I will narrow  antibiotic coverage to ceftriaxone as the patient has had pansensitive E. coli urinary tract infections in the past. 2.  SIRS: The patient does not completely meet criteria for sepsis although her low core temperature is very concerning.  Lactic acid is normal.  Monitor blood pressure and continue volume resuscitation. 3.  Hypothyroidism: The patient's TSH is very depressed.  Check free T4.  Her husband reports that her primary care doctor increased her dose of synthroid recently.We will hold thyroxine for now.  4.  Weakness: Multifactorial including hypothyroidism and UTI. 5.  Lethargy: With confusion; multifactorial.  Secondary to above. 6.  Dementia: compounds confusion; continue Remeron and Namenda 7.  Hyperlipidemia: Continue statin therapy 8.  DVT prophylaxis: Lovenox 9.  GI prophylaxis: None The patient is a full code.  Time spent on admission orders and patient care approximately 45 minutes  Arnaldo Natal, MD 11/05/2018, 7:09 AM

## 2018-11-05 NOTE — ED Triage Notes (Signed)
Per EMS husband reports pt fell out of bed. She has dementia and he was unable to give them her normal baseline. Pt is confused, shaking and has pinpoint pupils. She turned to EDP when he called her name. She has a c collar on

## 2018-11-06 ENCOUNTER — Other Ambulatory Visit: Payer: Medicare Other

## 2018-11-06 DIAGNOSIS — Z87891 Personal history of nicotine dependence: Secondary | ICD-10-CM | POA: Diagnosis not present

## 2018-11-06 DIAGNOSIS — Z79890 Hormone replacement therapy (postmenopausal): Secondary | ICD-10-CM | POA: Diagnosis not present

## 2018-11-06 DIAGNOSIS — Z20828 Contact with and (suspected) exposure to other viral communicable diseases: Secondary | ICD-10-CM | POA: Diagnosis present

## 2018-11-06 DIAGNOSIS — Z7982 Long term (current) use of aspirin: Secondary | ICD-10-CM | POA: Diagnosis not present

## 2018-11-06 DIAGNOSIS — F329 Major depressive disorder, single episode, unspecified: Secondary | ICD-10-CM | POA: Diagnosis present

## 2018-11-06 DIAGNOSIS — E785 Hyperlipidemia, unspecified: Secondary | ICD-10-CM | POA: Diagnosis present

## 2018-11-06 DIAGNOSIS — Z9181 History of falling: Secondary | ICD-10-CM | POA: Diagnosis not present

## 2018-11-06 DIAGNOSIS — Z8673 Personal history of transient ischemic attack (TIA), and cerebral infarction without residual deficits: Secondary | ICD-10-CM | POA: Diagnosis not present

## 2018-11-06 DIAGNOSIS — F0280 Dementia in other diseases classified elsewhere without behavioral disturbance: Secondary | ICD-10-CM | POA: Diagnosis present

## 2018-11-06 DIAGNOSIS — A419 Sepsis, unspecified organism: Secondary | ICD-10-CM | POA: Diagnosis present

## 2018-11-06 DIAGNOSIS — H409 Unspecified glaucoma: Secondary | ICD-10-CM | POA: Diagnosis present

## 2018-11-06 DIAGNOSIS — N3000 Acute cystitis without hematuria: Secondary | ICD-10-CM | POA: Diagnosis present

## 2018-11-06 DIAGNOSIS — G309 Alzheimer's disease, unspecified: Secondary | ICD-10-CM | POA: Diagnosis present

## 2018-11-06 DIAGNOSIS — G9341 Metabolic encephalopathy: Secondary | ICD-10-CM | POA: Diagnosis present

## 2018-11-06 DIAGNOSIS — Z79899 Other long term (current) drug therapy: Secondary | ICD-10-CM | POA: Diagnosis not present

## 2018-11-06 DIAGNOSIS — E039 Hypothyroidism, unspecified: Secondary | ICD-10-CM | POA: Diagnosis present

## 2018-11-06 MED ORDER — SODIUM CHLORIDE 0.9 % IV SOLN
1.0000 g | INTRAVENOUS | Status: DC
  Administered 2018-11-06: 1 g via INTRAVENOUS
  Filled 2018-11-06 (×2): qty 1

## 2018-11-06 NOTE — TOC Initial Note (Signed)
Transition of Care [facility]) - Initial/Assessment Note    Patient Details  Name: Jocelyn Gilbert MRN: [id number] Date of Birth: June 17, 1934  Transition of Care [facility]) CM/SW Contact:    Shelbie Hutching, RN Phone Number: 11/06/2018, 9:43 AM  Clinical Narrative:                 Patient placed in observation for urinary tract infection.  Patient is from home with her husband and has a history of Alzheimer's dementia.  RNCM spoke with patient's husband this morning, husband is primary caregiver, they have been dealing with the dementia for 15 years now.  Husband reports that patient is able to walk around the home and go up stairs holding on to the bannister railing.  Patient has a walker that the husband is working with her on using it, they also have canes, and a wheelchair.  Caregiver from [place] comes in 3 times per week for 5 hours at the time.  Patient is also followed by OP palliative [facility].  Husband would like home health services at discharge.  Husband chooses [place] and Floyd[facility] Flock with [facility] accepted referral.   RNCM will cont to follow.   Expected Discharge Plan: [place] Barriers to Discharge: Continued Medical Work up   Patient Goals and CMS Choice Patient states their goals for this hospitalization and ongoing recovery are:: Patient is unable to voice goals but her husband will care for her at home as long as he is able CMS Medicare.gov Compare Post Acute Care list provided to:: Patient Represenative (must comment)(Husband [name]) Choice offered to / list presented to : Spouse  Expected Discharge Plan and Services Expected Discharge Plan: [place]   Discharge Planning Services: CM Consult Post Acute Care Choice: [place] arrangements for the past 2 months: [place] Expected Discharge Date: 11/06/18                         HH Arranged: RN, PT [place] Agency: [place] ([place]) Date  [place]: 11/06/18 Time [place]: [phone] Representative spoke with at [place]: Floydene Flock  Prior Living Arrangements/Services Living arrangements for the past 2 months: [place] with:: Spouse Patient language and need for interpreter reviewed:: No Do you feel safe going back to the place where you live?: Yes      Need for Family Participation in Patient Care: Yes (Comment)(Dementia) Care giver support system in place?: Yes (comment)(husband) Current home services: DME(walker, wheelchair, canes) Criminal Activity/Legal Involvement Pertinent to Current Situation/Hospitalization: No - Comment as needed  Activities of Daily Living Home Assistive Devices/Equipment: None, Transfer belt, Walker (specify type) ADL Screening (condition at time of admission) Patient's cognitive ability adequate to safely complete daily activities?: No Is the patient deaf or have difficulty hearing?: No Does the patient have difficulty seeing, even when wearing glasses/contacts?: No Does the patient have difficulty concentrating, remembering, or making decisions?: No Patient able to express need for assistance with ADLs?: No Does the patient have difficulty dressing or bathing?: Yes Independently performs ADLs?: No Communication: Needs assistance Is this a change from baseline?: Pre-admission baseline Dressing (OT): Needs assistance Is this a change from baseline?: Pre-admission baseline Grooming: Dependent Is this a change from baseline?: Pre-admission baseline Feeding: Needs assistance Is this a change from baseline?: Pre-admission baseline Bathing: Needs assistance Is this a change from baseline?: Pre-admission baseline Toileting: Needs assistance  Is this a change from baseline?: Pre-admission baseline In/Out Bed: Needs assistance Is this a change from baseline?: Pre-admission baseline Walks in Home: Needs assistance Is this a change from baseline?: Pre-admission  baseline Does the patient have difficulty walking or climbing stairs?: Yes Weakness of Legs: Both Weakness of Arms/Hands: None  Permission Sought/Granted Permission sought to share information with : Case Manager, Other (comment) Permission granted to share information with : Yes, Verbal Permission Granted     Permission granted to share info w AGENCY: [facility]  Permission granted to share info w Relationship: Husband Channing MuttersRoy is primary caregiver     Emotional Assessment Appearance:: Appears stated age Attitude/Demeanor/Rapport: (dementia- confused)     Alcohol / Substance Use: Not Applicable Psych Involvement: No (comment)  Admission diagnosis:  Acute cystitis without hematuria [N30.00] Sepsis, due to unspecified organism, unspecified whether acute organ dysfunction present [facility](HCC) [A41.9] Patient Active Problem List   Diagnosis Date Noted  . UTI (urinary tract infection) 11/05/2018  . TIA (transient ischemic attack) 08/07/2016   PCP:  Lynnea FerrierKlein, Bert J III, MD Pharmacy:   [facility] - [address], [place] - [address] [place] [address] Phone: [phone] Fax: [phone]  [facility] [address] - [place], [place] - [address] [facility] [address] [facility] [address] [place] [place] [address] Phone: [phone] Fax: [phone]     Social Determinants of Health (SDOH) Interventions    Readmission Risk Interventions No flowsheet data found.

## 2018-11-06 NOTE — Care Management Obs Status (Signed)
[place] NOTIFICATION   Patient Details  Name: Jocelyn Gilbert MRN: [id number] Date of Birth: Apr 23, 1934   Medicare Observation Status Notification Given:  Yes    Shelbie Hutching, RN 11/06/2018, 9:34 AM

## 2018-11-06 NOTE — Evaluation (Signed)
Physical Therapy Evaluation Patient Details Name: Jocelyn Gilbert MRN: [id number] DOB: 28-Nov-1934 Today's Date: 11/06/2018   History of Present Illness  84 year old female with past medical history significant for hypothyroidism, severe Alzheimer's dementia was brought in from home secondary to a fall and confusion, fell out of bed and brough to ED.  Clinical Impression  Pt profoundly confused but very pleasant t/o the session.  She was physically able to do most tasks, but needed excessive cuing and constant hands on re-direction, etc to remain safe.  Pt with some unsteadiness while trying to turn loose of walker but did not have overt LOBs.  Pt will need 24/7 assist secondary to mental status, would benefit from HHPT to try to gather more safety, comfort and familiarity with walker use and for general balance/safety tasks.    Follow Up Recommendations Home health PT    Equipment Recommendations    none   Recommendations for Other Services       Precautions / Restrictions Precautions Precautions: Fall Restrictions Weight Bearing Restrictions: No      Mobility  Bed Mobility Overal bed mobility: Modified Independent             General bed mobility comments: Pt confused about getting up, but with cuing was able to do so w/o direct assist  Transfers Overall transfer level: Modified independent Equipment used: Rolling walker (2 wheeled)             General transfer comment: Pt needed cuing for set up and appropriate walker use, but able to rise w/o phyiscal assist   Ambulation/Gait Ambulation/Gait assistance: Min guard Gait Distance (Feet): 35 Feet Assistive device: Rolling walker (2 wheeled)       General Gait Details: Pt physically able to walk to/from the door but needed constant and excessive cuing to stay inside walker, avoid obstacles and generally even have a clue a to what was going on.  Stairs            Wheelchair Mobility    Modified  [place] (Stroke Patients Only)       Balance Overall balance assessment: Modified Independent                                           Pertinent Vitals/Pain Pain Assessment: No/denies pain    Home Living Family/patient expects to be discharged to:: Private residence Living Arrangements: Spouse/significant other               Additional Comments: Pt unable to answer any background questions    Prior Function           Comments: apparently husband is with her with essentially all mobility, has been trying to get her to use walker     Hand Dominance        Extremity/Trunk Assessment   Upper Extremity Assessment Upper Extremity Assessment: Difficult to assess due to impaired cognition;Generalized weakness    Lower Extremity Assessment Lower Extremity Assessment: Difficult to assess due to impaired cognition;Generalized weakness       Communication   Communication: Expressive difficulties(severe dementia limitations)  Cognition   Behavior During Therapy: Impulsive;Restless Overall Cognitive Status: History of cognitive impairments - at baseline  General Comments      Exercises     Assessment/Plan    PT Assessment Patient needs continued PT services  PT Problem List Decreased strength;Decreased activity tolerance;Decreased balance;Decreased mobility;Decreased knowledge of use of DME;Decreased cognition;Decreased safety awareness       PT Treatment Interventions Balance training;Therapeutic exercise;Therapeutic activities;Functional mobility training;Stair training;Gait training;DME instruction;Cognitive remediation;Patient/family education    PT Goals (Current goals can be found in the Care Plan section)  Acute Rehab PT Goals Patient Stated Goal: unable to state, very confused PT Goal Formulation: Patient unable to participate in goal setting Time For Goal Achievement:  11/20/18 Potential to Achieve Goals: Fair    Frequency Min 2X/week   Barriers to discharge        Co-evaluation               AM-PAC PT "6 Clicks" Mobility  Outcome Measure Help needed turning from your back to your side while in a flat bed without using bedrails?: None Help needed moving from lying on your back to sitting on the side of a flat bed without using bedrails?: None Help needed moving to and from a bed to a chair (including a wheelchair)?: A Little Help needed standing up from a chair using your arms (e.g., wheelchair or bedside chair)?: A Little Help needed to walk in hospital room?: A Little Help needed climbing 3-5 steps with a railing? : A Lot 6 Click Score: 19    End of Session Equipment Utilized During Treatment: Gait belt Activity Tolerance: (cognition far and away the biggest limiter) Patient left: with call bell/phone within reach;in chair(nursing notified that chair alarm hub missing) Nurse Communication: Mobility status PT Visit Diagnosis: Difficulty in walking, not elsewhere classified (R26.2);Unsteadiness on feet (R26.81)    Time: [id number] PT Time Calculation (min) (ACUTE ONLY): 21 min   Charges:   PT Evaluation $PT Eval Low Complexity: 1 Low          Malachi ProGalen R Jamelia Varano, DPT 11/06/2018, 1:19 PM

## 2018-11-06 NOTE — Progress Notes (Signed)
Please note, patient is currently followed by outpatient Palliative care at home. [place] aware. Flo Shanks BSN, RN, [employer] [phone]

## 2018-11-06 NOTE — Progress Notes (Signed)
[facility] [place] [facility]   PATIENT NAME: Jocelyn KannerMildred Gilbert    MR#:  [id number]  DATE OF BIRTH:  1935-02-26  SUBJECTIVE:  CHIEF COMPLAINT:   Chief Complaint  Patient presents with   Fall   -Confused and conversational this morning.  Periods of agitation requiring Haldol  REVIEW OF SYSTEMS:  Review of Systems  Unable to perform ROS: Dementia    DRUG ALLERGIES:  No Known Allergies  VITALS:  Blood pressure (!) 126/59, pulse 61, temperature 98.1 F (36.7 C), temperature source Oral, resp. rate 16, height 5\' 4"  (1.626 m), weight 73.9 kg, SpO2 96 %.  PHYSICAL EXAMINATION:  Physical Exam   GENERAL:  84 y.o.-year-old elderly patient lying in the bed with no acute distress.  EYES: Pupils equal, round, reactive to light and accommodation. No scleral icterus. Extraocular muscles intact.  HEENT: Head atraumatic, normocephalic. Oropharynx and nasopharynx clear.  NECK:  Supple, no jugular venous distention. No thyroid enlargement, no tenderness.  LUNGS: Normal breath sounds bilaterally, no wheezing, rales,rhonchi or crepitation. No use of accessory muscles of respiration.  Decreased bibasilar breath sounds CARDIOVASCULAR: S1, S2 normal. No  rubs, or gallops.  2/6 systolic murmur is ABDOMEN: Soft, nontender, nondistended. Bowel sounds present. No organomegaly or mass.  EXTREMITIES: No pedal edema, cyanosis, or clubbing.  NEUROLOGIC: Alert, able to move all extremities in bed.  Not following directions.  Appears very anxious.Marland Kitchen.  PSYCHIATRIC: The patient is alert but disoriented SKIN: No obvious rash, lesion, or ulcer.    LABORATORY PANEL:   CBC Recent Labs  Lab 11/05/18 0505  WBC 8.9  HGB 13.9  HCT 41.6  PLT 191   ------------------------------------------------------------------------------------------------------------------  Chemistries  Recent Labs  Lab 11/05/18 1020  NA 143  K 3.8  CL 114*  CO2 24  GLUCOSE 126*  BUN 9  CREATININE  0.49  CALCIUM 8.4*  AST 21  ALT 15  ALKPHOS 67  BILITOT 1.1   ------------------------------------------------------------------------------------------------------------------  Cardiac Enzymes No results for input(s): TROPONINI in the last 168 hours. ------------------------------------------------------------------------------------------------------------------  RADIOLOGY:  Ct Head Wo Contrast  Result Date: 11/05/2018 CLINICAL DATA:  Fall out of bed. Baseline dementia. Head trauma. Initial encounter. EXAM: CT HEAD WITHOUT CONTRAST CT CERVICAL SPINE WITHOUT CONTRAST TECHNIQUE: Multidetector CT imaging of the head and cervical spine was performed following the standard protocol without intravenous contrast. Multiplanar CT image reconstructions of the cervical spine were also generated. COMPARISON:  CT head without contrast 06/21/2018 FINDINGS: CT HEAD FINDINGS Brain: Advanced atrophy and diffuse white matter disease is stable. Basal ganglia are unchanged. The ventricles are proportionate to the degree of atrophy. No significant extraaxial fluid collection is present. Remote ischemic changes in the brainstem are stable. Cerebellum is unremarkable. Vascular: Atherosclerotic calcifications are present within the cavernous internal carotid arteries and at the dural margin of the vertebral arteries. There is no hyperdense vessel. Skull: Calvarium is intact. No focal lytic or blastic lesions are present. No significant extracranial soft tissue injury is present. Sinuses/Orbits: The paranasal sinuses and mastoid air cells are clear. The globes and orbits are within normal limits. CT CERVICAL SPINE FINDINGS Alignment: Grade 1 anterolisthesis at C4-5 is stable. AP alignment is otherwise anatomic. No significant curvature is present. Skull base and vertebrae: Craniocervical junction is normal. Advanced degenerative changes are again noted at C1-2. Vertebral body heights are maintained. No acute or healing  fractures are present. Soft tissues and spinal canal: No prevertebral fluid or swelling. No visible canal hematoma. Atherosclerotic calcifications are present at  the carotid bifurcations bilaterally. Disc levels: Uncovertebral and facet disease is again noted at multiple levels, most prominent at C4-5, C5-6, and C6-7. Osseous foraminal narrowing is present bilaterally at these levels. Upper chest: Lung apices are clear.  Thoracic inlet is normal. IMPRESSION: 1. No acute intracranial abnormality. 2. Stable advanced atrophy and diffuse white matter disease. This likely reflects the sequela of chronic microvascular ischemia. 3. No acute trauma to the head or cervical spine. 4. Stable multilevel degenerative changes in the cervical spine. 5. Atherosclerosis. Electronically Signed   By: Marin Robertshristopher  Mattern M.D.   On: 11/05/2018 07:05   Ct Cervical Spine Wo Contrast  Result Date: 11/05/2018 CLINICAL DATA:  Fall out of bed. Baseline dementia. Head trauma. Initial encounter. EXAM: CT HEAD WITHOUT CONTRAST CT CERVICAL SPINE WITHOUT CONTRAST TECHNIQUE: Multidetector CT imaging of the head and cervical spine was performed following the standard protocol without intravenous contrast. Multiplanar CT image reconstructions of the cervical spine were also generated. COMPARISON:  CT head without contrast 06/21/2018 FINDINGS: CT HEAD FINDINGS Brain: Advanced atrophy and diffuse white matter disease is stable. Basal ganglia are unchanged. The ventricles are proportionate to the degree of atrophy. No significant extraaxial fluid collection is present. Remote ischemic changes in the brainstem are stable. Cerebellum is unremarkable. Vascular: Atherosclerotic calcifications are present within the cavernous internal carotid arteries and at the dural margin of the vertebral arteries. There is no hyperdense vessel. Skull: Calvarium is intact. No focal lytic or blastic lesions are present. No significant extracranial soft tissue injury  is present. Sinuses/Orbits: The paranasal sinuses and mastoid air cells are clear. The globes and orbits are within normal limits. CT CERVICAL SPINE FINDINGS Alignment: Grade 1 anterolisthesis at C4-5 is stable. AP alignment is otherwise anatomic. No significant curvature is present. Skull base and vertebrae: Craniocervical junction is normal. Advanced degenerative changes are again noted at C1-2. Vertebral body heights are maintained. No acute or healing fractures are present. Soft tissues and spinal canal: No prevertebral fluid or swelling. No visible canal hematoma. Atherosclerotic calcifications are present at the carotid bifurcations bilaterally. Disc levels: Uncovertebral and facet disease is again noted at multiple levels, most prominent at C4-5, C5-6, and C6-7. Osseous foraminal narrowing is present bilaterally at these levels. Upper chest: Lung apices are clear.  Thoracic inlet is normal. IMPRESSION: 1. No acute intracranial abnormality. 2. Stable advanced atrophy and diffuse white matter disease. This likely reflects the sequela of chronic microvascular ischemia. 3. No acute trauma to the head or cervical spine. 4. Stable multilevel degenerative changes in the cervical spine. 5. Atherosclerosis. Electronically Signed   By: Marin Robertshristopher  Mattern M.D.   On: 11/05/2018 07:05   Dg Chest Port 1 View  Result Date: 11/05/2018 CLINICAL DATA:  Hypothermia.  Confusion. EXAM: PORTABLE CHEST 1 VIEW COMPARISON:  08/07/2016 FINDINGS: Patient is rotated to the left the heart size and mediastinal contours are within normal limits allowing for positioning. Aortic atherosclerosis. Both lungs are clear. IMPRESSION: No active disease. Electronically Signed   By: Danae OrleansJohn A Stahl M.D.   On: 11/05/2018 05:44    EKG:   Orders placed or performed during the hospital encounter of 11/05/18   ED EKG 12-Lead   ED EKG 12-Lead   EKG 12-Lead   EKG 12-Lead    ASSESSMENT AND PLAN:   84 year old female with past medical  history significant for hypothyroidism, severe Alzheimer's dementia was brought in from home secondary to a fall and confusion  1.  Acute encephalopathy-on top of dementia, fell out of bed  last night. -Likely secondary to acute metabolic encephalopathy from UTI -Patient is very close to baseline.  Being here is causing delirium on top of her severe dementia -Blood cultures are negative, urine cultures growing Citrobacter.  Currently on IV Rocephin.  Changed to oral antibiotics tomorrow -Physical therapy consulted  2.  Hypothyroidism-hold Synthroid as TSH is low, free T4 is slightly elevated at 1.6  3.  Alzheimer's dementia-very disoriented, alert at baseline.  Continue home medications.  Patient on Namenda, Exelon Having patient's husband at her bedside is helping with her dementia. Zyprexa twice daily started  4.  Depression-continue Remeron  5.  Glaucoma-continue eyedrops  6.  DVT prophylaxis-Lovenox  Physical therapy consult Possible discharge tomorrow with home health     All the records are reviewed and case discussed with Care Management/Social Workerr. Management plans discussed with the patient, family and they are in agreement.  CODE STATUS: Full code  TOTAL TIME TAKING CARE OF THIS PATIENT: 38 minutes.   POSSIBLE D/C tomorrow, DEPENDING ON CLINICAL CONDITION.   Gladstone Lighter M.D on 11/06/2018 at 12:36 PM  Between 7am to 6pm - Pager - [phone]  After 6pm go to [email or web address] - password [email or web address] [facility]  Office  [phone]  CC: Primary care physician; Adin Hector, MD

## 2018-11-06 NOTE — Progress Notes (Signed)
Pt refused morning meds and assessment. Pt extremely agitated. Told me to "Get out of here! Go away!" This nurse tried to calm pt, but it seemed to agitate her more. At that point I left her alone. Will continue to monitor.

## 2018-11-07 ENCOUNTER — Telehealth: Payer: Self-pay | Admitting: Student

## 2018-11-07 LAB — URINE CULTURE: Culture: >=100000 — AB

## 2018-11-07 MED ORDER — OLANZAPINE 2.5 MG PO TABS: 2.5000 mg | ORAL_TABLET | Freq: Every evening | ORAL | 0 refills | Status: AC | PRN

## 2018-11-07 MED ORDER — LEVOTHYROXINE SODIUM 50 MCG PO TABS: 50.0000 ug | ORAL_TABLET | Freq: Every day | ORAL | 2 refills | Status: DC

## 2018-11-07 MED ORDER — CEFDINIR 300 MG PO CAPS: 300.0000 mg | ORAL_CAPSULE | Freq: Two times a day (BID) | ORAL | 0 refills | Status: AC

## 2018-11-07 MED ORDER — CEPHALEXIN 500 MG PO CAPS: 500.0000 mg | ORAL_CAPSULE | Freq: Three times a day (TID) | ORAL | 0 refills | Status: DC

## 2018-11-07 MED ORDER — CEFDINIR 300 MG PO CAPS
300.0000 mg | ORAL_CAPSULE | Freq: Two times a day (BID) | ORAL | Status: DC
  Filled 2018-11-07 (×2): qty 1

## 2018-11-07 NOTE — Telephone Encounter (Signed)
Palliative NP received call from patient's husband. She has just returned home from hospital. Patient scheduled for f/u visit on 11/10/2018 at 1pm.

## 2018-11-07 NOTE — Progress Notes (Signed)
Patient refused oral medications. Gave them in applesauce and patient spit them out. Patient combative with eye drop administration.  Stacie Glaze, RN

## 2018-11-07 NOTE — TOC Progression Note (Signed)
Transition of Care [facility]) - Progression Note    Patient Details  Name: Jocelyn Gilbert MRN: [id number] Date of Birth: [birth year]  Transition of Care [facility]) CM/SW Contact  Shelbie Hutching, RN Phone Number: 11/07/2018, 12:33 PM  Clinical Narrative:    RNCM updated husband.  Husband reports that it is okay to speak with his daughter Malon Kindle.  RNCM left a message for [facility], she wanted and update and has concerns about home care.  Patient will be discharged with home health services from [place].  Patient is followed by outpatient palliative.  Patient also has a caregiver with [facility] that comes out 3 days a week for 5 hours a day.  Husband will pick up patient at discharge.    Expected Discharge Plan: [place] Barriers to Discharge: Barriers Resolved  Expected Discharge Plan and Services Expected Discharge Plan: [place]   Discharge Planning Services: CM Consult Post Acute Care Choice: [place] arrangements for the past 2 months: Single Family Home Expected Discharge Date: 11/07/18                         HH Arranged: RN, PT [facility] Agency: [place] ([place]) Date [place]: 11/06/18 Time [place]: [phone] Representative spoke with at [place]: [place] (SDOH) Interventions    Readmission Risk Interventions No flowsheet data found.

## 2018-11-07 NOTE — Progress Notes (Signed)
Pt is being discharged home with [facility]. Discharge papers placed in discharge packet.  Called spouse, Cathyann Kilfoyle to explain discharge packet content including meds and f/u appointment. Spouse verbalized understanding. Awaiting transportation.

## 2018-11-07 NOTE — TOC Transition Note (Signed)
Transition of Care [facility]) - CM/SW Discharge Note   Patient Details  Name: Jocelyn Gilbert MRN: [id number] Date of Birth: [birth year]  Transition of Care [facility]) CM/SW Contact:  Shelbie Hutching, RN Phone Number: 11/07/2018, 11:37 AM   Clinical Narrative:    Patient will discharge home with husband today.  Patient will have home health services with [place].  [facility] has been notified of patient discharge.    Final next level of care: Home w Home Health Services Barriers to Discharge: Barriers Resolved   Patient Goals and CMS Choice Patient states their goals for this hospitalization and ongoing recovery are:: Patient is unable to voice goals but her husband will care for her at home as long as he is able CMS Medicare.gov Compare Post Acute Care list provided to:: Patient Represenative (must comment)(Husband [name]) Choice offered to / list presented to : Spouse  Discharge Placement                       Discharge Plan and Services   Discharge Planning Services: CM Consult Post Acute Care Choice: Home Health                    HH Arranged: RN, PT [facility] Agency: [place] ([place]) Date [place]: 11/06/18 Time [place]: [phone] Representative spoke with at [place]: [place] (SDOH) Interventions     Readmission Risk Interventions No flowsheet data found.

## 2018-11-07 NOTE — Discharge Summary (Addendum)
[facility] - [place] [facility]   PATIENT NAME: Jocelyn Gilbert    MR#:  [id number]  DATE OF BIRTH:  [birth year]  DATE OF ADMISSION:  11/05/2018   ADMITTING PHYSICIAN: Arnaldo NatalMichael S Diamond, MD  DATE OF DISCHARGE:  11/07/18  PRIMARY CARE PHYSICIAN: Curtis SitesKlein, Bert J III, MD   ADMISSION DIAGNOSIS:   Acute cystitis without hematuria [N30.00] Sepsis, due to unspecified organism, unspecified whether acute organ dysfunction present (HCC) [A41.9]  DISCHARGE DIAGNOSIS:   Active Problems:   UTI (urinary tract infection)   SECONDARY DIAGNOSIS:   Past Medical History:  Diagnosis Date   Alzheimer disease (HCC)    History of TIAs    Thyroid disease     HOSPITAL COURSE:   84 year old female with past medical history significant for hypothyroidism, severe Alzheimer's dementia was brought in from home secondary to a fall and confusion  1.  Acute encephalopathy-on top of dementia, had a fall prior to admission, - secondary to acute metabolic encephalopathy from UTI -Prior similar presentations as well according to husband. -Patient is very close to baseline.  Being here is causing delirium on top of her severe dementia -Blood cultures are negative, urine cultures growing Citrobacter.   -Received IV cephalosporins in the hospital.  Previously responded well to oral Keflex in the past.  Will finish off the course with oral omnicef at home.  Also extra omnicef given for a 5-day treatment in the near future for acute UTI treatment until seen by PCP -Physical therapy consulted-they have recommended home health  2.  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  [facility].  Since her free T4 levels were high at 1.67  and TSH is very low, her Synthroid was held in the hospital for the last 3 days.  She will be discharged on low-dose Synthroid and will need outpatient follow-up with PCP  3.  Alzheimer's dementia-very  disoriented, alert at baseline.  Continue home medications.  Patient on Namenda, Exelon Having patient's husband at her bedside is helping with her agitation. Zyprexa at bedtime prn has been started here in the hospital- can continue as needed at home as well.  4.  Depression-continue Remeron  5.  Glaucoma-continue eyedrops   Physical therapy consult  discharge today with home health  DISCHARGE CONDITIONS:   Guarded  CONSULTS OBTAINED:   None  DRUG ALLERGIES:   No Known Allergies DISCHARGE MEDICATIONS:   Allergies as of 11/07/2018   No Known Allergies     Medication List    TAKE these medications   acetaminophen 325 MG tablet Commonly known as: TYLENOL Take 325 mg by mouth every 6 (six) hours as needed for headache.   aspirin EC 81 MG tablet Take 81 mg by mouth daily.   atorvastatin 40 MG tablet Commonly known as: LIPITOR Take 40 mg by mouth daily at 6 PM.   azelastine 0.1 % nasal spray Commonly known as: ASTELIN Place 1 spray into both nostrils 2 (two) times daily as needed for rhinitis or allergies.   B-12 2500 MCG Tabs Take 2,500 mcg by mouth daily.   cefdinir 300 MG capsule Commonly known as: OMNICEF Take 1 capsule (300 mg total) by mouth every 12 (twelve) hours for 3 days. Please take omnicef until 11/10/18. Another 5 day supply given for a future acute UTI   Combigan 0.2-0.5 % ophthalmic solution Generic drug: brimonidine-timolol Place 1 drop into both eyes 2 (two) times daily.   latanoprost 0.005 % ophthalmic solution Commonly  known as: XALATAN Place 1 drop into both eyes at bedtime.   levothyroxine 50 MCG tablet Commonly known as: Synthroid Take 1 tablet (50 mcg total) by mouth daily. What changed:   medication strength  how much to take  when to take this  additional instructions   mirtazapine 15 MG tablet Commonly known as: REMERON Take 15 mg by mouth at bedtime.   Namenda XR 28 MG Cp24 24 hr capsule Generic drug:  memantine Take 28 mg by mouth daily.   OLANZapine 2.5 MG tablet Commonly known as: ZYPREXA Take 1 tablet (2.5 mg total) by mouth at bedtime as needed (agitation).   rivastigmine 9.5 mg/24hr Commonly known as: EXELON Place 1 patch onto the skin daily.   Vitamin C Powd Take 500 mg by mouth daily.   Vitamin D (Ergocalciferol) 50 MCG (2000 UT) Caps Take 2 capsules by mouth.   zinc gluconate 50 MG tablet Take 50 mg by mouth daily.        DISCHARGE INSTRUCTIONS:   1. PCP f/u in 1-2 weeks 2. Palliative care follow up  DIET:   Regular diet  ACTIVITY:   Activity as tolerated  OXYGEN:   Home Oxygen: No.  Oxygen Delivery: room air  DISCHARGE LOCATION:   home   If you experience worsening of your admission symptoms, develop shortness of breath, life threatening emergency, suicidal or homicidal thoughts you must seek medical attention immediately by calling 911 or calling your MD immediately  if symptoms less severe.  You Must read complete instructions/literature along with all the possible adverse reactions/side effects for all the Medicines you take and that have been prescribed to you. Take any new Medicines after you have completely understood and accpet all the possible adverse reactions/side effects.   Please note  You were cared for by a hospitalist during your hospital stay. If you have any questions about your discharge medications or the care you received while you were in the hospital after you are discharged, you can call the unit and asked to speak with the hospitalist on call if the hospitalist that took care of you is not available. Once you are discharged, your primary care physician will handle any further medical issues. Please note that NO REFILLS for any discharge medications will be authorized once you are discharged, as it is imperative that you return to your primary care physician (or establish a relationship with a primary care physician if you do not  have one) for your aftercare needs so that they can reassess your need for medications and monitor your lab values.    On the day of Discharge:  VITAL SIGNS:   Blood pressure (!) 141/64, pulse 60, temperature 98.6 F (37 C), resp. rate 16, height 5\' 4"  (1.626 m), weight 73.9 kg, SpO2 94 %.  PHYSICAL EXAMINATION:     GENERAL:  84 y.o.-year-old elderly patient lying in the bed with no acute distress.  EYES: Pupils equal, round, reactive to light and accommodation. No scleral icterus. Extraocular muscles intact.  HEENT: Head atraumatic, normocephalic. Oropharynx and nasopharynx clear.  NECK:  Supple, no jugular venous distention. No thyroid enlargement, no tenderness.  LUNGS: Normal breath sounds bilaterally, no wheezing, rales,rhonchi or crepitation. No use of accessory muscles of respiration.  Decreased bibasilar breath sounds CARDIOVASCULAR: S1, S2 normal. No  rubs, or gallops.  2/6 systolic murmur is ABDOMEN: Soft, nontender, nondistended. Bowel sounds present. No organomegaly or mass.  EXTREMITIES: No pedal edema, cyanosis, or clubbing.  NEUROLOGIC: Alert, able to  move all extremities in bed.  Not following directions.  when awake, very anxious.Marland Kitchen.  PSYCHIATRIC: The patient is alert but disoriented SKIN: No obvious rash, lesion, or ulcer.   DATA REVIEW:   CBC Recent Labs  Lab 11/05/18 0505  WBC 8.9  HGB 13.9  HCT 41.6  PLT 191    Chemistries  Recent Labs  Lab 11/05/18 1020  NA 143  K 3.8  CL 114*  CO2 24  GLUCOSE 126*  BUN 9  CREATININE 0.49  CALCIUM 8.4*  AST 21  ALT 15  ALKPHOS 67  BILITOT 1.1     Microbiology Results  Results for orders placed or performed during the hospital encounter of 11/05/18  SARS Coronavirus 2 (CEPHEID - Performed in [facility] hospital lab), Hosp Order     Status: None   Collection Time: 11/05/18  5:05 AM   Specimen: Nasopharyngeal Swab  Result Value Ref Range Status   SARS Coronavirus 2 NEGATIVE NEGATIVE Final    Comment:  (NOTE) If result is NEGATIVE SARS-CoV-2 target nucleic acids are NOT DETECTED. The SARS-CoV-2 RNA is generally detectable in upper and lower  respiratory specimens during the acute phase of infection. The lowest  concentration of SARS-CoV-2 viral copies this assay can detect is 250  copies / mL. A negative result does not preclude SARS-CoV-2 infection  and should not be used as the sole basis for treatment or other  patient management decisions.  A negative result may occur with  improper specimen collection / handling, submission of specimen other  than nasopharyngeal swab, presence of viral mutation(s) within the  areas targeted by this assay, and inadequate number of viral copies  (<250 copies / mL). A negative result must be combined with clinical  observations, patient history, and epidemiological information. If result is POSITIVE SARS-CoV-2 target nucleic acids are DETECTED. The SARS-CoV-2 RNA is generally detectable in upper and lower  respiratory specimens dur ing the acute phase of infection.  Positive  results are indicative of active infection with SARS-CoV-2.  Clinical  correlation with patient history and other diagnostic information is  necessary to determine patient infection status.  Positive results do  not rule out bacterial infection or co-infection with other viruses. If result is PRESUMPTIVE POSTIVE SARS-CoV-2 nucleic acids MAY BE PRESENT.   A presumptive positive result was obtained on the submitted specimen  and confirmed on repeat testing.  While 2019 novel coronavirus  (SARS-CoV-2) nucleic acids may be present in the submitted sample  additional confirmatory testing may be necessary for epidemiological  and / or clinical management purposes  to differentiate between  SARS-CoV-2 and other Sarbecovirus currently known to infect humans.  If clinically indicated additional testing with an alternate test  methodology [phone]([id number]) is advised. The SARS-CoV-2 RNA is  generally  detectable in upper and lower respiratory sp ecimens during the acute  phase of infection. The expected result is Negative. Fact Sheet for Patients:  BoilerBrush.com.cyhttps://www.fda.gov/media/136312/download Fact Sheet for Healthcare Providers: https://pope.com/https://www.fda.gov/media/136313/download This test is not yet approved or cleared by the [place] FDA and has been authorized for detection and/or diagnosis of SARS-CoV-2 by FDA under an Emergency Use Authorization (EUA).  This EUA will remain in effect (meaning this test can be used) for the duration of the COVID-19 declaration under Section 564(b)(1) of the Act, 21 U.S.C. section 360bbb-3(b)(1), unless the authorization is terminated or revoked sooner. Performed at [facility], [address] [place], [place] [address]   Blood Culture (routine x 2)  Status: None (Preliminary result)   Collection Time: 11/05/18  5:05 AM   Specimen: BLOOD  Result Value Ref Range Status   Specimen Description BLOOD RIGHT ASSIST CONTROL  Final   Special Requests   Final    BOTTLES DRAWN AEROBIC AND ANAEROBIC Blood Culture results may not be optimal due to an excessive volume of blood received in culture bottles   Culture   Final    NO GROWTH 2 DAYS Performed at [facility], [address] [place], [place] [address]    Report Status PENDING  Incomplete  Urine culture     Status: Abnormal (Preliminary result)   Collection Time: 11/05/18  5:05 AM   Specimen: Urine, Random  Result Value Ref Range Status   Specimen Description   Final    URINE, RANDOM Performed at [facility], [address] [place], [place] [address]    Special Requests   Final    NONE Performed at [facility], [address] [place], [place] [address]    Culture >=100,000 COLONIES/mL CITROBACTER SPECIES (A)  Final   Report Status PENDING  Incomplete  Blood Culture (routine x 2)     Status: None (Preliminary result)   Collection Time:  11/05/18  5:06 AM   Specimen: BLOOD  Result Value Ref Range Status   Specimen Description BLOOD RIGHT FATTY CASTS  Final   Special Requests   Final    BOTTLES DRAWN AEROBIC AND ANAEROBIC Blood Culture adequate volume   Culture   Final    NO GROWTH 2 DAYS Performed at [facility], [address] [place], [place] [address]    Report Status PENDING  Incomplete    RADIOLOGY:  No results found.   Management plans discussed with the patient, family and they are in agreement.  CODE STATUS:     Code Status Orders  (From admission, onward)         Start     Ordered   11/05/18 0835  Full code  Continuous     11/05/18 0834        Code Status History    Date Active Date Inactive Code Status Order ID Comments User Context   08/07/2016 1837 08/08/2016 2032 Full Code [id number]  Milagros LollSudini, Srikar, MD ED   Advance Care Planning Activity    Advance Directive Documentation     Most Recent Value  Type of Advance Directive  Out of facility DNR (pink MOST or yellow form)  Pre-existing out of facility DNR order (yellow form or pink MOST form)  Yellow form placed in chart (order not valid for inpatient use)  "MOST" Form in Place?  --      TOTAL TIME TAKING CARE OF THIS PATIENT: 38 minutes.    Enid Baasadhika Marchella Hibbard M.D on 11/07/2018 at 8:20 AM  Between 7am to 6pm - Pager - [phone]  After 6pm go to [email or web address] - password [email or web address] [facility]  [facility]  Hospitalists  Office  [phone]  CC: Primary care physician; Lynnea FerrierKlein, Bert J III, MD   Note: This dictation was prepared with Dragon dictation along with smaller phrase technology. Any transcriptional errors that result from this process are unintentional.

## 2018-11-07 NOTE — Progress Notes (Signed)
Pt agitated and combative to staff when trying to provide care.  Pt refuses meds and VS. Per eMAR pt refused meds yesterday as well. Dr Tressia Miners made aware.

## 2018-11-10 ENCOUNTER — Other Ambulatory Visit: Payer: Medicare Other | Admitting: Student

## 2018-11-10 ENCOUNTER — Other Ambulatory Visit: Payer: Self-pay

## 2018-11-10 DIAGNOSIS — Z515 Encounter for palliative care: Secondary | ICD-10-CM

## 2018-11-10 LAB — CULTURE, BLOOD (ROUTINE X 2)
Culture: NO GROWTH
Culture: NO GROWTH
Special Requests: ADEQUATE

## 2018-11-10 NOTE — Progress Notes (Signed)
[place] Consult Note Telephone: [phone]  Fax: [phone]  PATIENT NAME: Jocelyn Gilbert DOB: Oct 12, 1934 MRN: [id number]  PRIMARY CARE PROVIDER:   Adin Hector, MD  REFERRING PROVIDER:  Adin Hector, MD [facility]  [place] [address]  RESPONSIBLE PARTY:  Husband, Jocelyn Gilbert  ASSESSMENT:  Met with patient, husband, daughter Jocelyn Gilbert. Jocelyn Gilbert mostly observes during visit; she does answer few direct questions, expressive aphasia noted. She is observed standing up unassisted and was then assisted to bathroom by her daughter. We discussed recent hospitalization. We discussed ongoing goals of care and symptom management. Mr. Matthies would like for patient to remain in the home with caregivers. She was receiving care from and agency, but husband is now requesting private caregivers and recommendations. He states he would not want for her to go to a facility. He is given the names of several caregivers recommended by SW. Proposed plan is for someone to come in for 12 hours to assist with morning routine, throughout day, and then assist to bed. Family was given opportunity to ask questions.     RECOMMENDATIONS and PLAN:  1. Code status: DNR. 2. Medical goals of therapy: for patient to receive home health therapy as directed. Mr. Gironda would like for patient to remain home with in home caregivers. Palliative Care to make recommendations as needed. Will monitor for changes and declines.  3. Symptom management: Appetite:continue to offer foods that patient enjoys; continue mirtazapine 20m qhs. Agitation-can give zyprexa 2.56mprn. Constipation-continue miralax 17gm daily prn; water encouraged.   Recommendations given to patient for private in home caregivers.  I spent 70 minutes providing this consultation,  from 1:00pm to 2:10pm. More than 50% of the time in this consultation was spent  coordinating communication.   HISTORY OF PRESENT ILLNESS:  Jocelyn Gilbert is a [age].o.  female with multiple medical problems including mixed Alzheimer's and vascular dementia, hypothyroidism, hyperlipidemia, hx of TIA, Vitamin B12 deficiency, osteopenia, Vitamin D insufficiency, depression. Palliative Care was asked to help address goals of care; she is seen for follow up visit today. She was hospitalized 7/29-7/31/2020 due to acute cystitis without hematuria, sepsis. She has required more assistance with adl's compared to prior to hospitalization. They are awaiting therapy evaluation. She is still receiving antibiotic therapy. She is able to get up to wheel chair with assist x 1. She requires more assistance, especially with her morning routine. She has not been ambulating as much. Her appetite varies; husband providing patient with foods she enjoys. Uncertain of her last bowel movement. She has been sleeping okay at night. Husband and daughter express need for additional support in the home as husband also has wrist fracture.       CODE STATUS: DNR  PPS: 40% HOSPICE ELIGIBILITY/DIAGNOSIS: TBD  PAST MEDICAL HISTORY:  Past Medical History:  Diagnosis Date   Alzheimer disease ([place]   History of TIAs    Thyroid disease     SOCIAL HX:  Social History   Tobacco Use   Smoking status: Former Smoker    Types: Cigarettes   Smokeless tobacco: Never Used  Substance Use Topics   Alcohol use: Yes    Comment: occas    ALLERGIES: No Known Allergies     PHYSICAL EXAM:   General: NAD, frail appearing, thin Cardiovascular: regular rate and rhythm Pulmonary: clear ant fields Abdomen: soft, nontender, + bowel sounds GU: no suprapubic  tenderness Extremities: mild pedal edema, no joint deformities Skin: no rashes Neurological: Weakness but otherwise nonfocal  Ezekiel Slocumb, NP

## 2018-11-17 ENCOUNTER — Telehealth: Payer: Self-pay

## 2018-11-17 NOTE — Telephone Encounter (Addendum)
SW received referral from NP and also received VM request from patient's husband, Jocelyn Gilbert to follow-up on questions regarding care.  SW contacted Jocelyn Gilbert to discuss. Jocelyn Gilbert provided brief history and overview of current care. Jocelyn Gilbert reports that patient was hospitalized a week ago and is needing more assistance at home since discharge. Patient is now more difficult to feed. Jocelyn Gilbert does have help from his daughter and is actively looking for a private caregiver. Jocelyn Gilbert mentioned that he wants to keep patient at home but has also considered a facility if he cannot manage care. Jocelyn Gilbert requested SW visit to discuss options. Roy in agreement with home visit on 11-21-18, wants to confirm time with patient's daughter and will follow-up.

## 2018-11-19 ENCOUNTER — Telehealth: Payer: Self-pay | Admitting: Student

## 2018-11-19 NOTE — Telephone Encounter (Signed)
Palliative NP received call from patient's husband. He states he had met with one of the recommended caregivers and it was arranged for her to start on Saturday, but she did not show up. He is scheduled to meet with another caregiver to discuss possibly caring for his wife. He also states home health came out once, but patient was agitated. We discussed the olanzapine, which he wants to hold off on using. We discussed using essential oils, such as lavender to help with calming. He states he will check on this. Will update palliative SW on conversation.

## 2018-11-21 ENCOUNTER — Other Ambulatory Visit: Payer: Self-pay

## 2018-11-21 ENCOUNTER — Other Ambulatory Visit: Payer: Medicare Other

## 2018-11-21 DIAGNOSIS — Z515 Encounter for palliative care: Secondary | ICD-10-CM

## 2018-11-21 NOTE — Progress Notes (Signed)
COMMUNITY PALLIATIVE CARE SW NOTE  PATIENT NAME: Jocelyn Gilbert DOB: Sep 04, 1934 MRN: [id number]  PRIMARY CARE PROVIDER: Adin Hector, MD  RESPONSIBLE PARTY:  Acct ID - Guarantor Home Phone Work Phone Relationship Acct Type  [id number] - [phone] [id number] Self P/F     [address], [name], [place] [address]     PLAN OF CARE and INTERVENTIONS:             1. GOALS OF CARE/ ADVANCE CARE PLANNING:  Patient is a DNR, form is in the home. Goal is to keep patient cared for at home, as long as they are able.  2. SOCIAL/EMOTIONAL/SPIRITUAL ASSESSMENT/ INTERVENTIONS:  SW met with patient, Jocelyn Gilbert (patient's husband) and Jocelyn Gilbert (patient's daughter) in the home. Patient was sitting in chair, appeared comfortable. Patient ate her lunch during visit, a small pizza and salad. Patient was friendly toward SW and calm, cooperative during visit. Jocelyn Gilbert said patient was agitated most of the morning. SW encouraged Jocelyn Gilbert to talk with PCP about agitation and anxiety. Jocelyn Gilbert provided brief history and update on patient. Discussed disease process and impact on daily care. Jocelyn Gilbert's main concern at this point is patient's care and long-term options. SW discussed hiring an aide with an agency or private, local adult daycare programs and facility care. Jocelyn Gilbert has talked with local agencies, and plans to talk with a private sitter next week. Jocelyn Gilbert is interested in an adult daycare program and plans to follow-up with [facility]. Jocelyn Gilbert also is talking with a local facility about memory care unit and the financial aspect of facility care. SW encouraged family to continue to consider all options and discuss with one another. 3. PATIENT/CAREGIVER EDUCATION/ COPINGCarloyn Gilbert expressed caregiver burnout. Jocelyn Gilbert has cared for patient since 2015. Jocelyn Gilbert shared his feelings and perspective as a caregiver, and expressed that he is grateful for this time and being able to care for patient but recognized that he is aging and needs to be safe.  Jocelyn Gilbert agrees and voiced that she, Jocelyn Gilbert's brother and a few close friends are happy to try and help but it can be challenging for Jocelyn Gilbert to "let go" at times. SW encouraged ongoing conversation between family about expectations, needs and concerns for long-term plan. SW provided supportive counseling, validated feelings, provided resource information and used active and reflective listening. 4. PERSONAL EMERGENCY PLAN:  Family will call 9-1-1 for emergencies. Due to COVID-19, patient is remaining at home other than doctor's appointments. 5. COMMUNITY RESOURCES COORDINATION/ HEALTH CARE NAVIGATION:  Family manages patient care. Jocelyn Gilbert acknowledged that managing patient's care is becoming increasingly difficult due to multiple agencies being involved. Jocelyn Gilbert is assisting and supporting patient and Jocelyn Gilbert. Patient is receiving home health RN, PT and aide with [facility]. Family is appreciative of palliative team support. 6. FINANCIAL/LEGAL CONCERNS/INTERVENTIONS:  None.     SOCIAL HX:  Social History   Tobacco Use  . Smoking status: Former Smoker    Types: Cigarettes  . Smokeless tobacco: Never Used  Substance Use Topics  . Alcohol use: Yes    Comment: occas    CODE STATUS:   Code Status: Prior (DNR) ADVANCED DIRECTIVES: N MOST FORM COMPLETE:  No. HOSPICE EDUCATION PROVIDED: N/a.  PPS: Patient is able to walk with standby assist. Patient can feed herself if prompted. Patient does need assistance with bathing, tolieting and dressing.   I spent 60 minutes with patient/family, from 1:00-2:00p providing education, support and consultation.   Jocelyn Lombard, LCSW

## 2018-12-16 ENCOUNTER — Inpatient Hospital Stay (HOSPITAL_COMMUNITY)
Admission: EM | Admit: 2018-12-16 | Discharge: 2018-12-21 | DRG: 481 | Disposition: A | Discharge status: Skilled Nursing Facility | Payer: Medicare Other | Attending: Internal Medicine | Admitting: Internal Medicine

## 2018-12-16 ENCOUNTER — Emergency Department (HOSPITAL_COMMUNITY): Payer: Medicare Other

## 2018-12-16 ENCOUNTER — Other Ambulatory Visit: Payer: Self-pay

## 2018-12-16 ENCOUNTER — Encounter (HOSPITAL_COMMUNITY): Payer: Self-pay

## 2018-12-16 DIAGNOSIS — W1830XA Fall on same level, unspecified, initial encounter: Secondary | ICD-10-CM | POA: Diagnosis present

## 2018-12-16 DIAGNOSIS — H409 Unspecified glaucoma: Secondary | ICD-10-CM | POA: Diagnosis present

## 2018-12-16 DIAGNOSIS — Z419 Encounter for procedure for purposes other than remedying health state, unspecified: Secondary | ICD-10-CM

## 2018-12-16 DIAGNOSIS — Z87891 Personal history of nicotine dependence: Secondary | ICD-10-CM

## 2018-12-16 DIAGNOSIS — E785 Hyperlipidemia, unspecified: Secondary | ICD-10-CM | POA: Diagnosis present

## 2018-12-16 DIAGNOSIS — Z8673 Personal history of transient ischemic attack (TIA), and cerebral infarction without residual deficits: Secondary | ICD-10-CM

## 2018-12-16 DIAGNOSIS — E538 Deficiency of other specified B group vitamins: Secondary | ICD-10-CM | POA: Diagnosis present

## 2018-12-16 DIAGNOSIS — Z79899 Other long term (current) drug therapy: Secondary | ICD-10-CM

## 2018-12-16 DIAGNOSIS — Y92009 Unspecified place in unspecified non-institutional (private) residence as the place of occurrence of the external cause: Secondary | ICD-10-CM

## 2018-12-16 DIAGNOSIS — E039 Hypothyroidism, unspecified: Secondary | ICD-10-CM

## 2018-12-16 DIAGNOSIS — F0281 Dementia in other diseases classified elsewhere with behavioral disturbance: Secondary | ICD-10-CM | POA: Diagnosis not present

## 2018-12-16 DIAGNOSIS — F0280 Dementia in other diseases classified elsewhere without behavioral disturbance: Secondary | ICD-10-CM | POA: Diagnosis present

## 2018-12-16 DIAGNOSIS — Z66 Do not resuscitate: Secondary | ICD-10-CM | POA: Diagnosis present

## 2018-12-16 DIAGNOSIS — S72001A Fracture of unspecified part of neck of right femur, initial encounter for closed fracture: Secondary | ICD-10-CM

## 2018-12-16 DIAGNOSIS — D649 Anemia, unspecified: Secondary | ICD-10-CM | POA: Diagnosis not present

## 2018-12-16 DIAGNOSIS — Z7982 Long term (current) use of aspirin: Secondary | ICD-10-CM

## 2018-12-16 DIAGNOSIS — G309 Alzheimer's disease, unspecified: Secondary | ICD-10-CM | POA: Diagnosis present

## 2018-12-16 DIAGNOSIS — S72141A Displaced intertrochanteric fracture of right femur, initial encounter for closed fracture: Principal | ICD-10-CM | POA: Diagnosis present

## 2018-12-16 DIAGNOSIS — D72829 Elevated white blood cell count, unspecified: Secondary | ICD-10-CM | POA: Diagnosis present

## 2018-12-16 DIAGNOSIS — F05 Delirium due to known physiological condition: Secondary | ICD-10-CM | POA: Diagnosis not present

## 2018-12-16 DIAGNOSIS — R739 Hyperglycemia, unspecified: Secondary | ICD-10-CM | POA: Diagnosis present

## 2018-12-16 DIAGNOSIS — Z20828 Contact with and (suspected) exposure to other viral communicable diseases: Secondary | ICD-10-CM | POA: Diagnosis present

## 2018-12-16 DIAGNOSIS — Z79890 Hormone replacement therapy (postmenopausal): Secondary | ICD-10-CM | POA: Diagnosis not present

## 2018-12-16 DIAGNOSIS — Z8659 Personal history of other mental and behavioral disorders: Secondary | ICD-10-CM

## 2018-12-16 LAB — PROTIME-INR
INR: 1 (ref 0.8–1.2)
Prothrombin Time: 12.8 seconds (ref 11.4–15.2)

## 2018-12-16 LAB — CBC WITH DIFFERENTIAL/PLATELET
Abs Immature Granulocytes: 0.08 10*3/uL — ABNORMAL HIGH (ref 0.00–0.07)
Basophils Absolute: 0.1 10*3/uL (ref 0.0–0.1)
Basophils Relative: 1 %
Eosinophils Absolute: 0.1 10*3/uL (ref 0.0–0.5)
Eosinophils Relative: 1 %
HCT: 43 % (ref 36.0–46.0)
Hemoglobin: 14.4 g/dL (ref 12.0–15.0)
Immature Granulocytes: 1 %
Lymphocytes Relative: 21 %
Lymphs Abs: 2.1 10*3/uL (ref 0.7–4.0)
MCH: 31 pg (ref 26.0–34.0)
MCHC: 33.5 g/dL (ref 30.0–36.0)
MCV: 92.7 fL (ref 80.0–100.0)
Monocytes Absolute: 0.6 10*3/uL (ref 0.1–1.0)
Monocytes Relative: 6 %
Neutro Abs: 7 10*3/uL (ref 1.7–7.7)
Neutrophils Relative %: 70 %
Platelets: 223 10*3/uL (ref 150–400)
RBC: 4.64 MIL/uL (ref 3.87–5.11)
RDW: 13.4 % (ref 11.5–15.5)
WBC: 9.9 10*3/uL (ref 4.0–10.5)
nRBC: 0 % (ref 0.0–0.2)

## 2018-12-16 LAB — BASIC METABOLIC PANEL
Anion gap: 11 (ref 5–15)
BUN: 12 mg/dL (ref 8–23)
CO2: 24 mmol/L (ref 22–32)
Calcium: 9.4 mg/dL (ref 8.9–10.3)
Chloride: 105 mmol/L (ref 98–111)
Creatinine, Ser: 0.73 mg/dL (ref 0.44–1.00)
GFR calc Af Amer: >60 mL/min (ref >60)
GFR calc non Af Amer: >60 mL/min (ref >60)
Glucose, Bld: 118 mg/dL — ABNORMAL HIGH (ref 70–99)
Potassium: 4.2 mmol/L (ref 3.5–5.1)
Sodium: 140 mmol/L (ref 135–145)

## 2018-12-16 LAB — SARS CORONAVIRUS 2 BY RT PCR (HOSPITAL ORDER, PERFORMED IN ~~LOC~~ HOSPITAL LAB): SARS Coronavirus 2: NEGATIVE

## 2018-12-16 MED ORDER — SODIUM CHLORIDE 0.45 % IV SOLN
INTRAVENOUS | Status: AC
  Administered 2018-12-16: 21:00:00 via INTRAVENOUS

## 2018-12-16 MED ORDER — METHOCARBAMOL 1000 MG/10ML IJ SOLN
500.0000 mg | Freq: Four times a day (QID) | INTRAVENOUS | Status: DC | PRN
  Filled 2018-12-16: qty 5

## 2018-12-16 MED ORDER — MORPHINE SULFATE (PF) 2 MG/ML IV SOLN
1.0000 mg | INTRAVENOUS | Status: DC | PRN
  Administered 2018-12-17: 2 mg via INTRAVENOUS
  Filled 2018-12-16: qty 1

## 2018-12-16 MED ORDER — METHOCARBAMOL 500 MG PO TABS: 500.0000 mg | ORAL_TABLET | Freq: Four times a day (QID) | ORAL | Status: DC | PRN

## 2018-12-16 MED ORDER — OLANZAPINE 2.5 MG PO TABS
2.5000 mg | ORAL_TABLET | Freq: Every evening | ORAL | Status: DC | PRN
  Administered 2018-12-16 – 2018-12-20 (×4): 2.5 mg via ORAL
  Filled 2018-12-16 (×5): qty 1

## 2018-12-16 MED ORDER — MORPHINE SULFATE (PF) 2 MG/ML IV SOLN
2.0000 mg | Freq: Once | INTRAVENOUS | Status: AC
  Administered 2018-12-16: 21:00:00 2 mg via INTRAVENOUS
  Filled 2018-12-16: qty 1

## 2018-12-16 MED ORDER — ONDANSETRON HCL 4 MG/2ML IJ SOLN
4.0000 mg | Freq: Four times a day (QID) | INTRAMUSCULAR | Status: DC | PRN
  Administered 2018-12-17: 14:00:00 4 mg via INTRAVENOUS

## 2018-12-16 NOTE — ED Provider Notes (Addendum)
[facility]-EMERGENCY DEPT Provider Note   CSN: [id number] Arrival date & time: 12/16/18  1847     History   Chief Complaint Chief Complaint  Patient presents with  . Fall  . Hip Pain    right    HPI Jocelyn Gilbert is a 84 y.o. female.     Patient is an 84 year old female with past medical history of Alzheimer's dementia, prior TIAs, and thyroid disease.  She is brought today for evaluation of fall.  Patient lives at home and was brought from there after falling and injuring her right hip.  Patient adds little additional history secondary to dementia and confusion.  The history is provided by the patient.  Fall This is a new problem. The current episode started 1 to 2 hours ago. The problem occurs constantly. The problem has not changed since onset.Nothing aggravates the symptoms. Nothing relieves the symptoms.  Hip Pain    Past Medical History:  Diagnosis Date  . Alzheimer disease (HCC)   . History of TIAs   . Thyroid disease     Patient Active Problem List   Diagnosis Date Noted  . UTI (urinary tract infection) 11/05/2018  . TIA (transient ischemic attack) 08/07/2016    Past Surgical History:  Procedure Laterality Date  . EYE SURGERY       OB History   No obstetric history on file.      Home Medications    Prior to Admission medications   Medication Sig Start Date End Date Taking? Authorizing Provider  acetaminophen (TYLENOL) 325 MG tablet Take 325 mg by mouth every 6 (six) hours as needed for headache.    [provider]  Ascorbic Acid (VITAMIN C) POWD Take 500 mg by mouth daily.    [provider]  aspirin EC 81 MG tablet Take 81 mg by mouth daily.    [provider]  atorvastatin (LIPITOR) 40 MG tablet Take 40 mg by mouth daily at 6 PM. 06/16/18   [provider]  azelastine (ASTELIN) 0.1 % nasal spray Place 1 spray into both nostrils 2 (two) times daily as needed for rhinitis or allergies.   03/12/18   [provider]  COMBIGAN 0.2-0.5 % ophthalmic solution Place 1 drop into both eyes 2 (two) times daily.  06/26/16   [provider]  Cyanocobalamin (B-12) 2500 MCG TABS Take 2,500 mcg by mouth daily.    [provider]  latanoprost (XALATAN) 0.005 % ophthalmic solution Place 1 drop into both eyes at bedtime. 07/13/16   [provider]  levothyroxine (SYNTHROID) 50 MCG tablet Take 1 tablet (50 mcg total) by mouth daily. 11/07/18 11/07/19  Enid BaasKalisetti, Radhika, MD  mirtazapine (REMERON) 15 MG tablet Take 15 mg by mouth at bedtime. 10/18/18   [provider]  NAMENDA XR 28 MG CP24 24 hr capsule Take 28 mg by mouth daily. 06/19/16   [provider]  OLANZapine (ZYPREXA) 2.5 MG tablet Take 1 tablet (2.5 mg total) by mouth at bedtime as needed (agitation). 11/07/18   Enid BaasKalisetti, Radhika, MD  rivastigmine (EXELON) 9.5 mg/24hr Place 1 patch onto the skin daily. 06/26/16   [provider]  Vitamin D, Ergocalciferol, 50 MCG (2000 UT) CAPS Take 2 capsules by mouth.    [provider]  zinc gluconate 50 MG tablet Take 50 mg by mouth daily.    [provider]    Family History History reviewed. No pertinent family history.  Social History Social History  Tobacco Use  . Smoking status: Former Smoker    Types: Cigarettes  . Smokeless tobacco: Never Used  Substance Use Topics  . Alcohol use: Yes    Comment: occas  . Drug use: No     Allergies   Patient has no known allergies.   Review of Systems Review of Systems  Unable to perform ROS: Dementia     Physical Exam Updated Vital Signs BP (!) 142/98 (BP Location: Right Arm)   Pulse 76   Temp 97.9 F (36.6 C) (Oral)   Resp 16   SpO2 94%   Physical Exam Vitals signs and nursing note reviewed.  Constitutional:      General: She is not in acute distress.    Appearance: She is well-developed. She is not diaphoretic.  HENT:     Head: Normocephalic and  atraumatic.  Neck:     Musculoskeletal: Normal range of motion and neck supple.  Cardiovascular:     Rate and Rhythm: Normal rate and regular rhythm.     Heart sounds: No murmur. No friction rub. No gallop.   Pulmonary:     Effort: Pulmonary effort is normal. No respiratory distress.     Breath sounds: Normal breath sounds. No wheezing.  Abdominal:     General: Bowel sounds are normal. There is no distension.     Palpations: Abdomen is soft.     Tenderness: There is no abdominal tenderness.  Musculoskeletal:     Comments: There is shortening and rotation of the right leg.  DP pulses are palpable.  She has pain with any range of motion.  Skin:    General: Skin is warm and dry.  Neurological:     Mental Status: She is alert.     Comments: Patient awake and alert.  Neurologic exam difficult secondary to dementia.      ED Treatments / Results  Labs (all labs ordered are listed, but only abnormal results are displayed) Labs Reviewed  BASIC METABOLIC PANEL  CBC WITH DIFFERENTIAL/PLATELET  PROTIME-INR    EKG None  Radiology No results found.  Procedures Procedures (including critical care time)  Medications Ordered in ED Medications - No data to display   Initial Impression / Assessment and Plan / ED Course  I have reviewed the triage vital signs and the nursing notes.  Pertinent labs & imaging results that were available during my care of the patient were reviewed by me and considered in my medical decision making (see chart for details).  Patient with history of dementia presenting with complaints of fall.  She is having right hip pain and x-rays reveal an intertrochanteric fracture.  This finding was discussed with Dr. Doran Durand from orthopedics.  He would like the patient admitted to the hospitalist service for surgical repair tomorrow.  I have spoken with Dr. Myna Hidalgo who agrees to admit.  Final Clinical Impressions(s) / ED Diagnoses   Final diagnoses:  None    ED  Discharge Orders    None       Veryl Speak, MD 12/16/18 2045    Veryl Speak, MD 12/16/18 2046

## 2018-12-16 NOTE — H&P (Signed)
History and Physical    Jocelyn Gilbert QMV:[id number] DOB: [birth year] DOA: 12/16/2018  PCP: Adin Hector, MD   Patient coming from: Home   Chief Complaint: Fall with right hip pain and inability to bear weight   HPI: Jocelyn Gilbert is a 84 y.o. female with medical history significant for Alzheimer dementia, history of TIAs, and hypothyroidism, now presenting to the emergency department for evaluation of right hip pain and inability to bear weight after a fall at home. She had been in her usual state when she stood up and immediately fell onto her right side, witnessed by home health aide who reports no head strike or LOC. The patient was unable to bear weight since then. She is normally ambulatory at home but not verbal.   ED Course: Upon arrival to the ED, patient is found to be afebrile, saturating mid 90s on room air, and with stable blood pressure.  EKG features a sinus rhythm with left axis deviation and chest x-ray is negative for acute cardiopulmonary disease.  Plain radiographs of the hip demonstrate acute intertrochanteric fracture of the proximal right femur.  Chemistry panel and CBC are unremarkable.  Orthopedic surgery was consulted by the ED physician, plans tentatively for surgical repair on 12/17/2018, and recommends a medical admission.  Review of Systems:  Unable to complete ROS secondary to the patient's clinical condition.  Past Medical History:  Diagnosis Date  . Alzheimer disease ([place])   . History of TIAs   . Thyroid disease     Past Surgical History:  Procedure Laterality Date  . EYE SURGERY       reports that she has quit smoking. Her smoking use included cigarettes. She has never used smokeless tobacco. She reports current alcohol use. She reports that she does not use drugs.  No Known Allergies  History reviewed. No pertinent family history.   Prior to Admission medications   Medication Sig Start Date End Date Taking? Authorizing Provider   acetaminophen (TYLENOL) 325 MG tablet Take 325 mg by mouth every 6 (six) hours as needed for headache.    [provider]  Ascorbic Acid (VITAMIN C) POWD Take 500 mg by mouth daily.    [provider]  aspirin EC 81 MG tablet Take 81 mg by mouth daily.    [provider]  atorvastatin (LIPITOR) 40 MG tablet Take 40 mg by mouth daily at 6 PM. 06/16/18   [provider]  azelastine (ASTELIN) 0.1 % nasal spray Place 1 spray into both nostrils 2 (two) times daily as needed for rhinitis or allergies.  03/12/18   [provider]  COMBIGAN 0.2-0.5 % ophthalmic solution Place 1 drop into both eyes 2 (two) times daily.  06/26/16   [provider]  Cyanocobalamin (B-12) 2500 MCG TABS Take 2,500 mcg by mouth daily.    [provider]  latanoprost (XALATAN) 0.005 % ophthalmic solution Place 1 drop into both eyes at bedtime. 07/13/16   [provider]  levothyroxine (SYNTHROID) 50 MCG tablet Take 1 tablet (50 mcg total) by mouth daily. 11/07/18 11/07/19  Gladstone Lighter, MD  mirtazapine (REMERON) 15 MG tablet Take 15 mg by mouth at bedtime. 10/18/18   [provider]  NAMENDA XR 28 MG CP24 24 hr capsule Take 28 mg by mouth daily. 06/19/16   [provider]  OLANZapine (ZYPREXA) 2.5 MG tablet Take 1 tablet (2.5 mg total) by mouth at bedtime as needed (agitation). 11/07/18   Gladstone Lighter, MD  rivastigmine (EXELON) 9.5 mg/24hr Place 1 patch onto the skin daily. 06/26/16   [provider]  Vitamin D, Ergocalciferol, 50 MCG (2000 UT) CAPS Take 2 capsules by mouth.    [provider]  zinc gluconate 50 MG tablet Take 50 mg by mouth daily.    [provider]    Physical Exam: Vitals:   12/16/18 1855 12/16/18 1858 12/16/18 2000 12/16/18 2027  BP:  (!) 142/98 (!) 141/70 (!) 141/70  Pulse:  76 61 (!) 53  Resp:  16 17 16   Temp:  97.9 F (36.6 C)    TempSrc:  Oral    SpO2: 94% 94%  94%     Constitutional: NAD, appears uncomfortable   Eyes: PERTLA, lids and conjunctivae normal ENMT: Mucous membranes are moist. Posterior pharynx clear of any exudate or lesions.   Neck: normal, supple, no masses, no thyromegaly Respiratory: clear to auscultation bilaterally, no wheezing, no crackles. No accessory muscle use.  Cardiovascular: S1 & S2 heard, regular rate and rhythm. No extremity edema.   Abdomen: No distension, no tenderness, soft. Bowel sounds normal.  Musculoskeletal: no clubbing / cyanosis. Right hip tender, neurovascularly intact distally.    Skin: no significant rashes, lesions, ulcers. Warm, dry, well-perfused. Neurologic: No gross facial asymmetry. Sensation intact. Moving all extremities spontaneously.  Psychiatric: Alert, no intelligible verbalizations. Calm.    Labs on Admission: I have personally reviewed following labs and imaging studies  CBC: Recent Labs  Lab 12/16/18 1930  WBC 9.9  NEUTROABS 7.0  HGB 14.4  HCT 43.0  MCV 92.7  PLT 223   Basic Metabolic Panel: Recent Labs  Lab 12/16/18 1930  NA 140  K 4.2  CL 105  CO2 24  GLUCOSE 118*  BUN 12  CREATININE 0.73  CALCIUM 9.4   GFR: CrCl cannot be calculated (Unknown ideal weight.). Liver Function Tests: No results for input(s): AST, ALT, ALKPHOS, BILITOT, PROT, ALBUMIN in the last 168 hours. No results for input(s): LIPASE, AMYLASE in the last 168 hours. No results for input(s): AMMONIA in the last 168 hours. Coagulation Profile: Recent Labs  Lab 12/16/18 1930  INR 1.0   Cardiac Enzymes: No results for input(s): CKTOTAL, CKMB, CKMBINDEX, TROPONINI in the last 168 hours. BNP (last 3 results) No results for input(s): PROBNP in the last 8760 hours. HbA1C: No results for input(s): HGBA1C in the last 72 hours. CBG: No results for input(s): GLUCAP in the last 168 hours. Lipid Profile: No results for input(s): CHOL, HDL, LDLCALC, TRIG, CHOLHDL, LDLDIRECT in the last 72 hours. Thyroid Function  Tests: No results for input(s): TSH, T4TOTAL, FREET4, T3FREE, THYROIDAB in the last 72 hours. Anemia Panel: No results for input(s): VITAMINB12, FOLATE, FERRITIN, TIBC, IRON, RETICCTPCT in the last 72 hours. Urine analysis:    Component Value Date/Time   COLORURINE YELLOW (A) 11/05/2018 0505   APPEARANCEUR HAZY (A) 11/05/2018 0505   LABSPEC 1.011 11/05/2018 0505   PHURINE 5.0 11/05/2018 0505   GLUCOSEU NEGATIVE 11/05/2018 0505   HGBUR LARGE (A) 11/05/2018 0505   BILIRUBINUR NEGATIVE 11/05/2018 0505   KETONESUR NEGATIVE 11/05/2018 0505   PROTEINUR NEGATIVE 11/05/2018 0505   NITRITE POSITIVE (A) 11/05/2018 0505   LEUKOCYTESUR MODERATE (A) 11/05/2018 0505   Sepsis Labs: @LABRCNTIP (procalcitonin:4,lacticidven:4) )No results found for this or any previous visit (from the past 240 hour(s)).   Radiological Exams on Admission: Dg Chest 1 View  Result Date: 12/16/2018 CLINICAL DATA:  Fall with hip fracture EXAM: CHEST  1 VIEW COMPARISON:  11/05/2018 FINDINGS:  No focal opacity or pleural effusion. Stable cardiomediastinal silhouette with aortic atherosclerosis. No pneumothorax. IMPRESSION: No active disease. Electronically Signed   By: Jasmine PangKim  Fujinaga M.D.   On: 12/16/2018 19:32   Dg Hip Unilat  With Pelvis 2-3 Views Right  Result Date: 12/16/2018 CLINICAL DATA:  [age] year old female fall in kitchen. EXAM: DG HIP (WITH OR WITHOUT PELVIS) 2-3V RIGHT COMPARISON:  03/01/2015 FINDINGS: Marked scoliosis deformity involving the lumbar spine, convex to the left. There is an acute, intertrochanteric fracture involving the proximal right femur. Medial angulation of the distal fracture fragments noted. IMPRESSION: 1. Acute intertrochanteric fracture of the proximal right femur. Electronically Signed   By: Signa Kellaylor  Stroud M.D.   On: 12/16/2018 19:31    EKG: Independently reviewed. Sinus rhythm, LAD.   Assessment/Plan   1. Right hip fracture  - Presents with right hip pain and inability to bear weight after  a ground level mechanical fall at home; she is found to have acute right intertrochanteric femur fracture  - Orthopedic surgery is consulting and planning for OR on 12/17/18  - Based on the available data, Jocelyn Gilbert presents an estimated 2% risk of perioperative MI or cardiac arrest  - Hold ASA, keep NPO after midnight, continue pain-control, supportive care    2. Dementia  - She is A&O x1 at baseline but usually able to ambulate on her own per family  - Continue Namenda, Remeron, and as-needed Zyprexa     3. Hypothyroidism  - Continue Synthroid, dose recently adjusted     4. History of TIA's  - Hold ASA, continue Lipitor     PPE: Mask, face shield  DVT prophylaxis: SCD's  Code Status: DNR confirmed with husband who is agreeable to suspend this for surgery  Family Communication: Discussed with husband at bedside  Consults called: Orthopedic surgery  Admission status: Inpatient     Briscoe Deutscherimothy S , MD [facility] Pager [phone]  If 7PM-7AM, please contact night-coverage [email or web address] Password [email or web address] [email or web address]  12/16/2018, 8:52 PM

## 2018-12-16 NOTE — ED Triage Notes (Signed)
Patient arrived via [facility] from home.   Patient c/o ground level fall in the kitchen.  Patient able to get up and sit on near by chair.   C/O right side hip pain.   Hz. Alzheimers  A/OX1 to self at baseline.   No shortening or rotation or deformity noted by ems.   C/o pain with rane of motion and extension.

## 2018-12-16 NOTE — ED Notes (Signed)
Patient transported to X-ray 

## 2018-12-17 ENCOUNTER — Encounter (HOSPITAL_COMMUNITY): Payer: Self-pay

## 2018-12-17 ENCOUNTER — Inpatient Hospital Stay (HOSPITAL_COMMUNITY): Payer: Medicare Other | Admitting: Anesthesiology

## 2018-12-17 ENCOUNTER — Inpatient Hospital Stay (HOSPITAL_COMMUNITY): Payer: Medicare Other

## 2018-12-17 ENCOUNTER — Encounter (HOSPITAL_COMMUNITY)
Admission: EM | Disposition: A | Discharge status: Skilled Nursing Facility | Payer: Self-pay | Source: Home / Self Care | Attending: Internal Medicine

## 2018-12-17 HISTORY — PX: FEMUR IM NAIL: SHX1597

## 2018-12-17 LAB — CBC
HCT: 40.9 % (ref 36.0–46.0)
Hemoglobin: 13.3 g/dL (ref 12.0–15.0)
MCH: 30.9 pg (ref 26.0–34.0)
MCHC: 32.5 g/dL (ref 30.0–36.0)
MCV: 95.1 fL (ref 80.0–100.0)
Platelets: 157 10*3/uL (ref 150–400)
RBC: 4.3 MIL/uL (ref 3.87–5.11)
RDW: 13.2 % (ref 11.5–15.5)
WBC: 12.2 10*3/uL — ABNORMAL HIGH (ref 4.0–10.5)
nRBC: 0 % (ref 0.0–0.2)

## 2018-12-17 LAB — SURGICAL PCR SCREEN
MRSA, PCR: NEGATIVE
Staphylococcus aureus: NEGATIVE

## 2018-12-17 LAB — BASIC METABOLIC PANEL
Anion gap: 11 (ref 5–15)
BUN: 11 mg/dL (ref 8–23)
CO2: 23 mmol/L (ref 22–32)
Calcium: 8.8 mg/dL — ABNORMAL LOW (ref 8.9–10.3)
Chloride: 104 mmol/L (ref 98–111)
Creatinine, Ser: 0.61 mg/dL (ref 0.44–1.00)
GFR calc Af Amer: >60 mL/min (ref >60)
GFR calc non Af Amer: >60 mL/min (ref >60)
Glucose, Bld: 145 mg/dL — ABNORMAL HIGH (ref 70–99)
Potassium: 3.8 mmol/L (ref 3.5–5.1)
Sodium: 138 mmol/L (ref 135–145)

## 2018-12-17 LAB — TYPE AND SCREEN
ABO/RH(D): A POS
Antibody Screen: NEGATIVE

## 2018-12-17 LAB — ABO/RH: ABO/RH(D): A POS

## 2018-12-17 SURGERY — INSERTION, INTRAMEDULLARY ROD, FEMUR
Anesthesia: Spinal | Site: Hip | Laterality: Right

## 2018-12-17 MED ORDER — BUPIVACAINE IN DEXTROSE 0.75-8.25 % IT SOLN
INTRATHECAL | Status: DC | PRN
  Administered 2018-12-17: 1.6 mL via INTRATHECAL

## 2018-12-17 MED ORDER — PHENYLEPHRINE 40 MCG/ML (10ML) SYRINGE FOR IV PUSH (FOR BLOOD PRESSURE SUPPORT)
PREFILLED_SYRINGE | INTRAVENOUS | Status: DC | PRN
  Administered 2018-12-17: 40 ug via INTRAVENOUS
  Administered 2018-12-17: 80 ug via INTRAVENOUS
  Administered 2018-12-17 (×2): 40 ug via INTRAVENOUS
  Administered 2018-12-17: 80 ug via INTRAVENOUS
  Administered 2018-12-17 (×2): 40 ug via INTRAVENOUS
  Administered 2018-12-17: 80 ug via INTRAVENOUS

## 2018-12-17 MED ORDER — ADULT MULTIVITAMIN W/MINERALS CH: 1.0000 | ORAL_TABLET | Freq: Every day | ORAL | Status: DC

## 2018-12-17 MED ORDER — ENSURE PRE-SURGERY PO LIQD
296.0000 mL | Freq: Once | ORAL | Status: DC
  Filled 2018-12-17: qty 296

## 2018-12-17 MED ORDER — ATORVASTATIN CALCIUM 40 MG PO TABS
40.0000 mg | ORAL_TABLET | Freq: Every day | ORAL | Status: DC
  Administered 2018-12-18: 40 mg via ORAL
  Filled 2018-12-17 (×2): qty 1

## 2018-12-17 MED ORDER — HYDROCODONE-ACETAMINOPHEN 7.5-325 MG PO TABS
1.0000 | ORAL_TABLET | ORAL | Status: DC | PRN
  Administered 2018-12-19: 2 via ORAL
  Administered 2018-12-20 – 2018-12-21 (×3): 1 via ORAL
  Filled 2018-12-17: qty 2
  Filled 2018-12-17 (×2): qty 1
  Filled 2018-12-17: qty 2
  Filled 2018-12-17: qty 1

## 2018-12-17 MED ORDER — RIVASTIGMINE 13.3 MG/24HR TD PT24
13.3000 mg | MEDICATED_PATCH | Freq: Every day | TRANSDERMAL | Status: DC
  Administered 2018-12-17 – 2018-12-21 (×5): 13.3 mg via TRANSDERMAL
  Filled 2018-12-17 (×5): qty 1

## 2018-12-17 MED ORDER — MENTHOL 3 MG MT LOZG: 1.0000 | LOZENGE | OROMUCOSAL | Status: DC | PRN

## 2018-12-17 MED ORDER — SENNOSIDES-DOCUSATE SODIUM 8.6-50 MG PO TABS: 1.0000 | ORAL_TABLET | Freq: Every evening | ORAL | Status: DC | PRN

## 2018-12-17 MED ORDER — PROPOFOL 10 MG/ML IV BOLUS
INTRAVENOUS | Status: AC
  Filled 2018-12-17: qty 60

## 2018-12-17 MED ORDER — LEVOTHYROXINE SODIUM 112 MCG PO TABS
112.0000 ug | ORAL_TABLET | Freq: Every day | ORAL | Status: DC
  Administered 2018-12-17 – 2018-12-20 (×4): 112 ug via ORAL
  Filled 2018-12-17 (×6): qty 1

## 2018-12-17 MED ORDER — FENTANYL CITRATE (PF) 100 MCG/2ML IJ SOLN
INTRAMUSCULAR | Status: DC | PRN
  Administered 2018-12-17: 25 ug via INTRAVENOUS

## 2018-12-17 MED ORDER — BRIMONIDINE TARTRATE-TIMOLOL 0.2-0.5 % OP SOLN: 1.0000 [drp] | Freq: Two times a day (BID) | OPHTHALMIC | Status: DC

## 2018-12-17 MED ORDER — SODIUM CHLORIDE 0.9 % IR SOLN
Status: DC | PRN
  Administered 2018-12-17: 1000 mL

## 2018-12-17 MED ORDER — MIRTAZAPINE 15 MG PO TABS
15.0000 mg | ORAL_TABLET | Freq: Every day | ORAL | Status: DC
  Administered 2018-12-17 – 2018-12-20 (×5): 15 mg via ORAL
  Filled 2018-12-17 (×5): qty 1

## 2018-12-17 MED ORDER — ONDANSETRON HCL 4 MG/2ML IJ SOLN
INTRAMUSCULAR | Status: AC
  Filled 2018-12-17: qty 2

## 2018-12-17 MED ORDER — CEFAZOLIN SODIUM-DEXTROSE 2-4 GM/100ML-% IV SOLN
2.0000 g | Freq: Four times a day (QID) | INTRAVENOUS | Status: AC
  Administered 2018-12-17 – 2018-12-18 (×2): 2 g via INTRAVENOUS
  Filled 2018-12-17 (×2): qty 100

## 2018-12-17 MED ORDER — MUPIROCIN 2 % EX OINT: 1.0000 "application " | TOPICAL_OINTMENT | Freq: Two times a day (BID) | CUTANEOUS | Status: DC

## 2018-12-17 MED ORDER — FENTANYL CITRATE (PF) 100 MCG/2ML IJ SOLN: 25.0000 ug | INTRAMUSCULAR | Status: DC | PRN

## 2018-12-17 MED ORDER — ENOXAPARIN SODIUM 40 MG/0.4ML ~~LOC~~ SOLN
40.0000 mg | SUBCUTANEOUS | Status: DC
  Administered 2018-12-18 – 2018-12-21 (×4): 40 mg via SUBCUTANEOUS
  Filled 2018-12-17 (×4): qty 0.4

## 2018-12-17 MED ORDER — PROMETHAZINE HCL 25 MG/ML IJ SOLN: 6.2500 mg | INTRAMUSCULAR | Status: DC | PRN

## 2018-12-17 MED ORDER — ONDANSETRON HCL 4 MG PO TABS: 4.0000 mg | ORAL_TABLET | Freq: Four times a day (QID) | ORAL | Status: DC | PRN

## 2018-12-17 MED ORDER — LATANOPROST 0.005 % OP SOLN
1.0000 [drp] | Freq: Every day | OPHTHALMIC | Status: DC
  Administered 2018-12-17 – 2018-12-20 (×5): 1 [drp] via OPHTHALMIC
  Filled 2018-12-17: qty 2.5

## 2018-12-17 MED ORDER — MEMANTINE HCL ER 28 MG PO CP24
28.0000 mg | ORAL_CAPSULE | Freq: Every day | ORAL | Status: DC
  Administered 2018-12-17 – 2018-12-21 (×5): 28 mg via ORAL
  Filled 2018-12-17 (×5): qty 1

## 2018-12-17 MED ORDER — ALBUMIN HUMAN 5 % IV SOLN
12.5000 g | Freq: Once | INTRAVENOUS | Status: AC
  Administered 2018-12-17: 15:00:00 12.5 g via INTRAVENOUS

## 2018-12-17 MED ORDER — EPHEDRINE SULFATE-NACL 50-0.9 MG/10ML-% IV SOSY
PREFILLED_SYRINGE | INTRAVENOUS | Status: DC | PRN
  Administered 2018-12-17: 10 mg via INTRAVENOUS

## 2018-12-17 MED ORDER — BRIMONIDINE TARTRATE 0.2 % OP SOLN
1.0000 [drp] | Freq: Two times a day (BID) | OPHTHALMIC | Status: DC
  Administered 2018-12-17 – 2018-12-21 (×9): 1 [drp] via OPHTHALMIC
  Filled 2018-12-17: qty 5

## 2018-12-17 MED ORDER — DOCUSATE SODIUM 100 MG PO CAPS
100.0000 mg | ORAL_CAPSULE | Freq: Two times a day (BID) | ORAL | Status: DC
  Administered 2018-12-17 – 2018-12-20 (×4): 100 mg via ORAL
  Filled 2018-12-17 (×7): qty 1

## 2018-12-17 MED ORDER — CHLORHEXIDINE GLUCONATE 4 % EX LIQD: 60.0000 mL | Freq: Once | CUTANEOUS | Status: DC

## 2018-12-17 MED ORDER — MORPHINE SULFATE (PF) 2 MG/ML IV SOLN: 0.5000 mg | INTRAVENOUS | Status: DC | PRN

## 2018-12-17 MED ORDER — KETAMINE HCL 50 MG/ML IJ SOLN
INTRAMUSCULAR | Status: DC | PRN
  Administered 2018-12-17 (×2): 10 mg via INTRAMUSCULAR

## 2018-12-17 MED ORDER — ACETAMINOPHEN 325 MG PO TABS
325.0000 mg | ORAL_TABLET | Freq: Four times a day (QID) | ORAL | Status: DC | PRN
  Filled 2018-12-17: qty 1

## 2018-12-17 MED ORDER — ENSURE ENLIVE PO LIQD: 237.0000 mL | Freq: Two times a day (BID) | ORAL | Status: DC

## 2018-12-17 MED ORDER — VITAMIN B-12 1000 MCG PO TABS
2500.0000 ug | ORAL_TABLET | Freq: Every day | ORAL | Status: DC
  Administered 2018-12-18: 09:00:00 2500 ug via ORAL
  Filled 2018-12-17 (×3): qty 3

## 2018-12-17 MED ORDER — POVIDONE-IODINE 10 % EX SWAB: 2.0000 "application " | Freq: Once | CUTANEOUS | Status: DC

## 2018-12-17 MED ORDER — SENNA 8.6 MG PO TABS
1.0000 | ORAL_TABLET | Freq: Two times a day (BID) | ORAL | Status: DC
  Administered 2018-12-17 – 2018-12-20 (×5): 8.6 mg via ORAL
  Filled 2018-12-17 (×7): qty 1

## 2018-12-17 MED ORDER — FENTANYL CITRATE (PF) 100 MCG/2ML IJ SOLN
INTRAMUSCULAR | Status: AC
  Filled 2018-12-17: qty 2

## 2018-12-17 MED ORDER — METOCLOPRAMIDE HCL 5 MG/ML IJ SOLN: 5.0000 mg | Freq: Three times a day (TID) | INTRAMUSCULAR | Status: DC | PRN

## 2018-12-17 MED ORDER — KETAMINE HCL 10 MG/ML IJ SOLN
INTRAMUSCULAR | Status: AC
  Filled 2018-12-17: qty 1

## 2018-12-17 MED ORDER — ALBUMIN HUMAN 5 % IV SOLN
INTRAVENOUS | Status: AC
  Filled 2018-12-17: qty 250

## 2018-12-17 MED ORDER — PROPOFOL 500 MG/50ML IV EMUL
INTRAVENOUS | Status: DC | PRN
  Administered 2018-12-17: 25 ug/kg/min via INTRAVENOUS

## 2018-12-17 MED ORDER — PROPOFOL 10 MG/ML IV BOLUS
INTRAVENOUS | Status: DC | PRN
  Administered 2018-12-17: 10 mg via INTRAVENOUS

## 2018-12-17 MED ORDER — ONDANSETRON HCL 4 MG/2ML IJ SOLN: 4.0000 mg | Freq: Four times a day (QID) | INTRAMUSCULAR | Status: DC | PRN

## 2018-12-17 MED ORDER — METOCLOPRAMIDE HCL 5 MG PO TABS: 5.0000 mg | ORAL_TABLET | Freq: Three times a day (TID) | ORAL | Status: DC | PRN

## 2018-12-17 MED ORDER — HYDROCODONE-ACETAMINOPHEN 5-325 MG PO TABS
1.0000 | ORAL_TABLET | ORAL | Status: DC | PRN
  Administered 2018-12-17 – 2018-12-20 (×5): 1 via ORAL
  Filled 2018-12-17: qty 1
  Filled 2018-12-17: qty 2
  Filled 2018-12-17 (×5): qty 1

## 2018-12-17 MED ORDER — PHENOL 1.4 % MT LIQD
1.0000 | OROMUCOSAL | Status: DC | PRN
  Filled 2018-12-17: qty 177

## 2018-12-17 MED ORDER — TRANEXAMIC ACID-NACL 1000-0.7 MG/100ML-% IV SOLN
1000.0000 mg | INTRAVENOUS | Status: AC
  Administered 2018-12-17: 1000 mg via INTRAVENOUS
  Filled 2018-12-17: qty 100

## 2018-12-17 MED ORDER — SODIUM CHLORIDE 0.45 % IV SOLN
INTRAVENOUS | Status: AC
  Administered 2018-12-17: 16:00:00 via INTRAVENOUS

## 2018-12-17 MED ORDER — CEFAZOLIN SODIUM-DEXTROSE 2-4 GM/100ML-% IV SOLN
2.0000 g | INTRAVENOUS | Status: AC
  Administered 2018-12-17: 13:00:00 2 g via INTRAVENOUS
  Filled 2018-12-17: qty 100

## 2018-12-17 MED ORDER — TIMOLOL MALEATE 0.5 % OP SOLN
1.0000 [drp] | Freq: Two times a day (BID) | OPHTHALMIC | Status: DC
  Administered 2018-12-17 – 2018-12-21 (×9): 1 [drp] via OPHTHALMIC
  Filled 2018-12-17: qty 5

## 2018-12-17 MED ORDER — EPHEDRINE 5 MG/ML INJ
INTRAVENOUS | Status: AC
  Filled 2018-12-17: qty 10

## 2018-12-17 MED ORDER — LACTATED RINGERS IV SOLN
INTRAVENOUS | Status: DC
  Administered 2018-12-17: 1000 mL via INTRAVENOUS
  Administered 2018-12-17: 12:00:00 via INTRAVENOUS

## 2018-12-17 SURGICAL SUPPLY — 44 items
ADH SKN CLS APL DERMABOND .7 (GAUZE/BANDAGES/DRESSINGS) ×1
APL PRP STRL LF DISP 70% ISPRP (MISCELLANEOUS) ×1
BAG SPEC THK2 15X12 ZIP CLS (MISCELLANEOUS)
BAG ZIPLOCK 12X15 (MISCELLANEOUS) IMPLANT
BIT DRILL AO GAMMA 4.2X340 (BIT) ×1 IMPLANT
CHLORAPREP W/TINT 26 (MISCELLANEOUS) ×2 IMPLANT
COVER PERINEAL POST (MISCELLANEOUS) ×2 IMPLANT
COVER SURGICAL LIGHT HANDLE (MISCELLANEOUS) ×2 IMPLANT
COVER WAND RF STERILE (DRAPES) IMPLANT
DERMABOND ADVANCED (GAUZE/BANDAGES/DRESSINGS) ×1
DERMABOND ADVANCED .7 DNX12 (GAUZE/BANDAGES/DRESSINGS) ×1 IMPLANT
DRAPE C-ARM 42X120 X-RAY (DRAPES) ×2 IMPLANT
DRAPE C-ARMOR (DRAPES) ×2 IMPLANT
DRAPE SHEET LG 3/4 BI-LAMINATE (DRAPES) ×3 IMPLANT
DRAPE STERI IOBAN 125X83 (DRAPES) ×2 IMPLANT
DRAPE U-SHAPE 47X51 STRL (DRAPES) ×4 IMPLANT
DRSG MEPILEX BORDER 4X4 (GAUZE/BANDAGES/DRESSINGS) ×4 IMPLANT
DRSG MEPILEX BORDER 4X8 (GAUZE/BANDAGES/DRESSINGS) ×1 IMPLANT
ELECT BLADE TIP CTD 4 INCH (ELECTRODE) IMPLANT
FACESHIELD WRAPAROUND (MASK) ×4 IMPLANT
FACESHIELD WRAPAROUND OR TEAM (MASK) ×2 IMPLANT
GAUZE SPONGE 4X4 12PLY STRL (GAUZE/BANDAGES/DRESSINGS) ×2 IMPLANT
GLOVE BIO SURGEON STRL SZ8.5 (GLOVE) ×4 IMPLANT
GLOVE BIOGEL PI IND STRL 8.5 (GLOVE) ×1 IMPLANT
GLOVE BIOGEL PI INDICATOR 8.5 (GLOVE) ×1
GOWN SPEC L3 XXLG W/TWL (GOWN DISPOSABLE) ×2 IMPLANT
K-WIRE  3.2X450M STR (WIRE) ×1
K-WIRE 3.2X450M STR (WIRE) ×1
KIT BASIN OR (CUSTOM PROCEDURE TRAY) ×2 IMPLANT
KIT TURNOVER KIT A (KITS) IMPLANT
KWIRE 3.2X450M STR (WIRE) IMPLANT
MANIFOLD NEPTUNE II (INSTRUMENTS) ×2 IMPLANT
MARKER SKIN DUAL TIP RULER LAB (MISCELLANEOUS) ×2 IMPLANT
NAIL TROCH GAMMA 3 KT (Nail) ×1 IMPLANT
PACK TOTAL JOINT (CUSTOM PROCEDURE TRAY) ×2 IMPLANT
SCREW LAG GAMMA 3 TI 10.5X90MM (Screw) ×1 IMPLANT
SCREW LOCKING T2 F/T  5X37.5MM (Screw) ×1 IMPLANT
SCREW LOCKING T2 F/T 5X37.5MM (Screw) IMPLANT
SUT MNCRL AB 3-0 PS2 18 (SUTURE) ×1 IMPLANT
SUT MON AB 2-0 CT1 36 (SUTURE) ×2 IMPLANT
TOWEL OR 17X26 10 PK STRL BLUE (TOWEL DISPOSABLE) ×2 IMPLANT
TOWEL OR NON WOVEN STRL DISP B (DISPOSABLE) ×2 IMPLANT
TRAY FOLEY MTR SLVR 14FR STAT (SET/KITS/TRAYS/PACK) ×1 IMPLANT
YANKAUER SUCT BULB TIP NO VENT (SUCTIONS) ×1 IMPLANT

## 2018-12-17 NOTE — Progress Notes (Signed)
PROGRESS NOTE    Jocelyn GuardMildred P Gilbert  ZOX:[id number] DOB: 1935/03/29 DOA: 12/16/2018 PCP: Lynnea FerrierKlein, Bert J III, MD   Brief Narrative:  HPI per Dr. Odie Seraimothy Opyd on 12/16/2018 Jocelyn GuardMildred P Gilbert is a 84 y.o. female with medical history significant for Alzheimer dementia, history of TIAs, and hypothyroidism, now presenting to the emergency department for evaluation of right hip pain and inability to bear weight after a fall at home. She had been in her usual state when she stood up and immediately fell onto her right side, witnessed by home health aide who reports no head strike or LOC. The patient was unable to bear weight since then. She is normally ambulatory at home but not verbal.   ED Course: Upon arrival to the ED, patient is found to be afebrile, saturating mid 90s on room air, and with stable blood pressure.  EKG features a sinus rhythm with left axis deviation and chest x-ray is negative for acute cardiopulmonary disease.  Plain radiographs of the hip demonstrate acute intertrochanteric fracture of the proximal right femur.  Chemistry panel and CBC are unremarkable.  Orthopedic surgery was consulted by the ED physician, plans tentatively for surgical repair on 12/17/2018, and recommends a medical admission.  **Interim History Patient was seen by orthopedic surgery and she is going down for operative intervention today.  I discussed the case with patient's daughter at bedside and all questions were answered to her satisfaction.  Assessment & Plan:   Principal Problem:   Closed fracture of right hip (HCC) Active Problems:   Alzheimer disease (HCC)   History of TIAs   Hypothyroidism  Acute Intrertrochanteric Fracture of the Right Proximal Femur  -Presents with right hip pain and inability to bear weight after a ground level mechanical fall at home; she is found to have acute right intertrochanteric femur fracture on X-Ray -Orthopedic surgery Dr. Victorino DikeHewitt is consulting and planning for OR on  12/17/18; Dr. Linna CapriceSwinteck to take the patient for intervention later today - Based on the available data, Jocelyn Gilbert presents an estimated 2% risk of perioperative MI or cardiac arrest  - Hold ASA, keep NPO after midnight, continue pain-control, supportive care   -C/w Gentle IVF with 1/2 NS at 90 mL/hr until operative intervention and for only 12 hours -Further care per orthopedic surgery and she is n.p.o. prior to surgery now for her right hip -Currently has pain control with methocarbamol 500 mg p.o./IV every 6 as needed for muscle spasm along with IV Morphine 1 to 2 mg every 3 hours for moderate and severe pain  -Continue with antiemetics with tomograms IV ondansetron every 6 PRN for nausea vomiting -Currently getting preoperative antibiotics with cefazolin  Dementia  - She is A&O x1 at baseline but usually able to ambulate on her own per family  - Continue Memantine 28 mg p.o. daily, Mirtazepine 15 mg p.o. nightly, Rivistigmine 13.3 transdermal patch every 24 daily and as-needed Olanzapine 2.5 mg p.o. nightly as needed agitation     Hypothyroidism  - Continue Levothyroxine 112 mcg po Daily, dose recently adjusted     History of TIA's  -Currently Hold ASA for Surgery -continue atorvastatin 40 mils p.o. daily   Leukocytosis -Mild. WBC went 9.9 -> 12.2 -Likely Reactive in the setting of Pain -Continue to Monitor for S/Sx of Infection -Continue to Trend and Repeat CBC in AM   Hyperglycemia -Blood Sugar on Admission was 118 and repeat this AM was 145 -Last HbA1c on 11/05/2018 was 5.1 -Continue to Monitor  HLD -C/w Atrovastatin 40 mg p.o. nightly  Glaucoma -C/w Brimonidine 0.2 % Ophthalmic Solution 1 drop both eyes, Latanoprost 1 drop Both Eyes qHS. And Timolol 1 drop both eyes BID  Vitamin B12 deficiency -Continue with 2500 mcg p.o. daily   DVT prophylaxis: SCDs; further VT prophylaxis per orthopedic surgery Code Status: DO NOT RESUSCITATE  Family Communication: Discussed  with daughter at bedside Disposition Plan: Pending further work-up and evaluation by PT OT.  Also needs orthopedic surgery clearance   Consultants:   Orthopedic Surgery Dr. Victorino DikeHewitt   Procedures:  Displaced right intratrochanteric femur fracture status post operative intervention with IM nailing by orthopedic surgery currently being done   Antimicrobials:  Anti-infectives (From admission, onward)   None     Subjective: Seen and examined at bedside patient is pleasantly demented and only alert to herself.  No nausea or vomiting.  Discussed with daughter at bedside and explained to her about prognosis of hip fractures.  Patient unable to provide a subjective history due to her severe dementia.  Appears comfortable and not vocalizing any pain currently.  No other concerns complaints at this time.  Objective: Vitals:   12/17/18 0100 12/17/18 0143 12/17/18 0207 12/17/18 0614  BP: 121/63 124/68  120/72  Pulse: 62 63  67  Resp: 15 18  18   Temp:  99.2 F (37.3 C)  97.9 F (36.6 C)  TempSrc:  Axillary  Axillary  SpO2: 96% 98%  100%  Weight:   57.8 kg     Intake/Output Summary (Last 24 hours) at 12/17/2018 0748 Last data filed at 12/17/2018 0617 Gross per 24 hour  Intake 711.32 ml  Output 150 ml  Net 561.32 ml   Filed Weights   12/17/18 0207  Weight: 57.8 kg    Examination: Physical Exam:  Constitutional: Thin pleasantly demented elderly Caucasian female currently NAD and appears calm and comfortable Eyes: Lids and conjunctivae normal, sclerae anicteric  ENMT: External Ears, Nose appear normal. Grossly normal hearing.  Neck: Appears normal, supple, no cervical masses, normal ROM, no appreciable thyromegaly; no JVD Respiratory: Diminished to auscultation bilaterally, no wheezing, rales, rhonchi or crackles. Normal respiratory effort and patient is not tachypenic. No accessory muscle use.  Cardiovascular: RRR, no murmurs / rubs / gallops. S1 and S2 auscultated. No extremity  edema. 2+ pedal pulses. No carotid bruits.  Abdomen: Soft, non-tender, non-distended. No masses palpated. No appreciable hepatosplenomegaly. Bowel sounds positive x4.  GU: Deferred. Musculoskeletal: Right leg is shorter and externally rotated compared to the left Skin: No rashes, lesions, ulcers on limited skin evaluation. No induration; Warm and dry.  Neurologic: CN 2-12 grossly intact with no focal deficits. Romberg sign and cerebellar reflexes not assessed.  Psychiatric: Impaired judgment and insight. Alert and oriented x 1. Pleasantly demented mood and appropriate affect.   Data Reviewed: I have personally reviewed following labs and imaging studies  CBC: Recent Labs  Lab 12/16/18 1930 12/17/18 0230  WBC 9.9 12.2*  NEUTROABS 7.0  --   HGB 14.4 13.3  HCT 43.0 40.9  MCV 92.7 95.1  PLT 223 157   Basic Metabolic Panel: Recent Labs  Lab 12/16/18 1930 12/17/18 0230  NA 140 138  K 4.2 3.8  CL 105 104  CO2 24 23  GLUCOSE 118* 145*  BUN 12 11  CREATININE 0.73 0.61  CALCIUM 9.4 8.8*   GFR: Estimated Creatinine Clearance: 47.6 mL/min (by C-G formula based on SCr of 0.61 mg/dL). Liver Function Tests: No results for input(s): AST, ALT, ALKPHOS, BILITOT,  PROT, ALBUMIN in the last 168 hours. No results for input(s): LIPASE, AMYLASE in the last 168 hours. No results for input(s): AMMONIA in the last 168 hours. Coagulation Profile: Recent Labs  Lab 12/16/18 1930  INR 1.0   Cardiac Enzymes: No results for input(s): CKTOTAL, CKMB, CKMBINDEX, TROPONINI in the last 168 hours. BNP (last 3 results) No results for input(s): PROBNP in the last 8760 hours. HbA1C: No results for input(s): HGBA1C in the last 72 hours. CBG: No results for input(s): GLUCAP in the last 168 hours. Lipid Profile: No results for input(s): CHOL, HDL, LDLCALC, TRIG, CHOLHDL, LDLDIRECT in the last 72 hours. Thyroid Function Tests: No results for input(s): TSH, T4TOTAL, FREET4, T3FREE, THYROIDAB in the last  72 hours. Anemia Panel: No results for input(s): VITAMINB12, FOLATE, FERRITIN, TIBC, IRON, RETICCTPCT in the last 72 hours. Sepsis Labs: No results for input(s): PROCALCITON, LATICACIDVEN in the last 168 hours.  Recent Results (from the past 240 hour(s))  SARS Coronavirus 2 [facility] order, Performed in [facility] hospital lab) Nasopharyngeal Nasopharyngeal Swab     Status: None   Collection Time: 12/16/18  8:37 PM   Specimen: Nasopharyngeal Swab  Result Value Ref Range Status   SARS Coronavirus 2 NEGATIVE NEGATIVE Final    Comment: (NOTE) If result is NEGATIVE SARS-CoV-2 target nucleic acids are NOT DETECTED. The SARS-CoV-2 RNA is generally detectable in upper and lower  respiratory specimens during the acute phase of infection. The lowest  concentration of SARS-CoV-2 viral copies this assay can detect is 250  copies / mL. A negative result does not preclude SARS-CoV-2 infection  and should not be used as the sole basis for treatment or other  patient management decisions.  A negative result may occur with  improper specimen collection / handling, submission of specimen other  than nasopharyngeal swab, presence of viral mutation(s) within the  areas targeted by this assay, and inadequate number of viral copies  (<250 copies / mL). A negative result must be combined with clinical  observations, patient history, and epidemiological information. If result is POSITIVE SARS-CoV-2 target nucleic acids are DETECTED. The SARS-CoV-2 RNA is generally detectable in upper and lower  respiratory specimens dur ing the acute phase of infection.  Positive  results are indicative of active infection with SARS-CoV-2.  Clinical  correlation with patient history and other diagnostic information is  necessary to determine patient infection status.  Positive results do  not rule out bacterial infection or co-infection with other viruses. If result is PRESUMPTIVE POSTIVE SARS-CoV-2 nucleic acids MAY  BE PRESENT.   A presumptive positive result was obtained on the submitted specimen  and confirmed on repeat testing.  While 2019 novel coronavirus  (SARS-CoV-2) nucleic acids may be present in the submitted sample  additional confirmatory testing may be necessary for epidemiological  and / or clinical management purposes  to differentiate between  SARS-CoV-2 and other Sarbecovirus currently known to infect humans.  If clinically indicated additional testing with an alternate test  methodology [phone]) is advised. The SARS-CoV-2 RNA is generally  detectable in upper and lower respiratory sp ecimens during the acute  phase of infection. The expected result is Negative. Fact Sheet for Patients:  BoilerBrush.com.cy Fact Sheet for Healthcare Providers: https://pope.com/ This test is not yet approved or cleared by the [place] FDA and has been authorized for detection and/or diagnosis of SARS-CoV-2 by FDA under an Emergency Use Authorization (EUA).  This EUA will remain in effect (meaning this test can be used) for  the duration of the COVID-19 declaration under Section 564(b)(1) of the Act, 21 U.S.C. section 360bbb-3(b)(1), unless the authorization is terminated or revoked sooner. Performed at [facility], [place] [address], [place], [place] [address]     Radiology Studies: Dg Chest 1 View  Result Date: 12/16/2018 CLINICAL DATA:  Fall with hip fracture EXAM: CHEST  1 VIEW COMPARISON:  11/05/2018 FINDINGS: No focal opacity or pleural effusion. Stable cardiomediastinal silhouette with aortic atherosclerosis. No pneumothorax. IMPRESSION: No active disease. Electronically Signed   By: Donavan Foil M.D.   On: 12/16/2018 19:32   Dg Hip Unilat  With Pelvis 2-3 Views Right  Result Date: 12/16/2018 CLINICAL DATA:  84 year old female fall in kitchen. EXAM: DG HIP (WITH OR WITHOUT PELVIS) 2-3V RIGHT COMPARISON:  03/01/2015  FINDINGS: Marked scoliosis deformity involving the lumbar spine, convex to the left. There is an acute, intertrochanteric fracture involving the proximal right femur. Medial angulation of the distal fracture fragments noted. IMPRESSION: 1. Acute intertrochanteric fracture of the proximal right femur. Electronically Signed   By: Kerby Moors M.D.   On: 12/16/2018 19:31   Scheduled Meds: . atorvastatin  40 mg Oral q1800  . brimonidine  1 drop Both Eyes BID  . latanoprost  1 drop Both Eyes QHS  . levothyroxine  112 mcg Oral Q0600  . memantine  28 mg Oral Daily  . mirtazapine  15 mg Oral QHS  . rivastigmine  13.3 mg Transdermal Daily  . timolol  1 drop Both Eyes BID  . vitamin B-12  2,500 mcg Oral Daily   Continuous Infusions: . sodium chloride 90 mL/hr at 12/17/18 0600  . methocarbamol (ROBAXIN) IV      LOS: 1 day   Kerney Elbe, DO [facility] PAGER is on AMION  If 7PM-7AM, please contact night-coverage [email or web address] Password [email or web address] 12/17/2018, 7:48 AM \

## 2018-12-17 NOTE — Progress Notes (Signed)
Initial Nutrition Assessment  RD working remotely.   DOCUMENTATION CODES:   Not applicable  INTERVENTION:  - diet advancement as medically feasible. - will order Ensure Enlive BID, each supplement provides 350 kcal and 20 grams of protein. - will order daily multivitamin with minerals. - continue to encourage PO intakes.    NUTRITION DIAGNOSIS:   Increased nutrient needs related to post-op healing as evidenced by estimated needs.  GOAL:   Patient will meet greater than or equal to 90% of their needs  MONITOR:   PO intake, Supplement acceptance, Labs, Weight trends  REASON FOR ASSESSMENT:   Consult Assessment of nutrition requirement/status  ASSESSMENT:   84 y.o. female with medical history significant for Alzheimer dementia, TIAs, and hypothyroidism. She presented to the ED on 9/8 for evaluation of right hip pain and inability to bear weight after a fall at home. She had been in her usual state when she stood up and immediately fell onto her R side, witnessed by home health aide who reports no head strike or LOC. She is normally ambulatory at home but not verbal.  Patient has been NPO since admission. Per flow sheet, patient is disoriented x4. Patient is currently in the OR; unable to assess patient.  Per chart review, current weight is 127 lb, weight at [place] on 11/25/18 was 119 lb, and weight at [facility] on 05/12/18 was 145 lb. This indicates 18 lb weight loss (12.4% body weight) in the past 7 months; significant for time frame. Will continue to monitor weight trends. She received 1 carton of Ensure Pre-Surgery earlier this AM.   Per notes: - pending SLP evaluation when feasible. - R hip fx s/p fall--OR for surgical fixation - dementia--a/o to self only at baseline   Labs reviewed. Medications reviewed; 112 mcg oral synthroid/day, 2500 mcg oral vitamin B12/day.      NUTRITION - FOCUSED PHYSICAL EXAM:  unable to complete at this time.   Diet Order:   Diet Order           Diet NPO time specified Except for: Sips with Meds  Diet effective now              EDUCATION NEEDS:   No education needs have been identified at this time  Skin:  Skin Assessment: Reviewed RN Assessment  Last BM:  PTA/unknown  Height:   Ht Readings from Last 1 Encounters:  12/17/18 5\' 3"  (1.6 m)    Weight:   Wt Readings from Last 1 Encounters:  12/17/18 57.8 kg    Ideal Body Weight:  52.3 kg  BMI:  Body mass index is 22.57 kg/m.  Estimated Nutritional Needs:   Kcal:  1735-1910 kcal  Protein:  75-85 grams  Fluid:  >/= 1.8 L/day     Jarome Matin, MS, RD, LDN, [facility] Inpatient Clinical Dietitian Pager # [phone] After hours/weekend pager # [phone]

## 2018-12-17 NOTE — Anesthesia Postprocedure Evaluation (Signed)
Anesthesia Post Note  Patient: Jocelyn Gilbert  Procedure(s) Performed: INTRAMEDULLARY (IM) NAIL FEMORAL RIGHT (Right Hip)     Patient location during evaluation: PACU Anesthesia Type: Spinal Level of consciousness: awake and alert Pain management: pain level controlled Vital Signs Assessment: post-procedure vital signs reviewed and stable Respiratory status: spontaneous breathing and respiratory function stable Cardiovascular status: blood pressure returned to baseline and stable Postop Assessment: spinal receding Anesthetic complications: no    Last Vitals:  Vitals:   12/17/18 1447 12/17/18 1500  BP: (!) 95/54 (!) 99/54  Pulse:  (!) 58  Resp:  11  Temp:    SpO2:  97%    Last Pain:  Vitals:   12/17/18 1145  TempSrc:   PainSc: 0-No pain                 Hensley Aziz DANIEL

## 2018-12-17 NOTE — Discharge Instructions (Signed)
 Dr. Kelechi Orgeron Adult Hip & Knee Specialist [facility] [address] [place], [place] [address] [phone]   POSTOPERATIVE DIRECTIONS    Hip Rehabilitation, Guidelines Following Surgery   WEIGHT BEARING Weight bearing as tolerated with assist device (walker, cane, etc) as directed, use it as long as suggested by your surgeon or therapist, typically at least 4-6 weeks.   HOME CARE INSTRUCTIONS  Remove items at home which could result in a fall. This includes throw rugs or furniture in walking pathways.  Continue medications as instructed at time of discharge.  You may have some home medications which will be placed on hold until you complete the course of blood thinner medication.  4 days after discharge, you may start showering. No tub baths or soaking your incisions. Do not put on socks or shoes without following the instructions of your caregivers.   Sit on chairs with arms. Use the chair arms to help push yourself up when arising.  Arrange for the use of a toilet seat elevator so you are not sitting low.   Walk with walker as instructed.  You may resume a sexual relationship in one month or when given the OK by your caregiver.  Use walker as long as suggested by your caregivers.  Avoid periods of inactivity such as sitting longer than an hour when not asleep. This helps prevent blood clots.  You may return to work once you are cleared by your surgeon.  Do not drive a car for 6 weeks or until released by your surgeon.  Do not drive while taking narcotics.  Wear elastic stockings for two weeks following surgery during the day but you may remove then at night.  Make sure you keep all of your appointments after your operation with all of your doctors and caregivers. You should call the office at the above phone number and make an appointment for approximately two weeks after the date of your surgery. Please pick up a stool softener and laxative  for home use as long as you are requiring pain medications.  ICE to the affected hip every three hours for 30 minutes at a time and then as needed for pain and swelling. Continue to use ice on the hip for pain and swelling from surgery. You may notice swelling that will progress down to the foot and ankle.  This is normal after surgery.  Elevate the leg when you are not up walking on it.   It is important for you to complete the blood thinner medication as prescribed by your doctor.  Continue to use the breathing machine which will help keep your temperature down.  It is common for your temperature to cycle up and down following surgery, especially at night when you are not up moving around and exerting yourself.  The breathing machine keeps your lungs expanded and your temperature down.  RANGE OF MOTION AND STRENGTHENING EXERCISES  These exercises are designed to help you keep full movement of your hip joint. Follow your caregiver's or physical therapist's instructions. Perform all exercises about fifteen times, three times per day or as directed. Exercise both hips, even if you have had only one joint replacement. These exercises can be done on a training (exercise) mat, on the floor, on a table or on a bed. Use whatever works the best and is most comfortable for you. Use music or television while you are exercising so that the exercises are a pleasant break in your day. This   will make your life better with the exercises acting as a break in routine you can look forward to.  Lying on your back, slowly slide your foot toward your buttocks, raising your knee up off the floor. Then slowly slide your foot back down until your leg is straight again.  Lying on your back spread your legs as far apart as you can without causing discomfort.  Lying on your side, raise your upper leg and foot straight up from the floor as far as is comfortable. Slowly lower the leg and repeat.  Lying on your back, tighten up the  muscle in the front of your thigh (quadriceps muscles). You can do this by keeping your leg straight and trying to raise your heel off the floor. This helps strengthen the largest muscle supporting your knee.  Lying on your back, tighten up the muscles of your buttocks both with the legs straight and with the knee bent at a comfortable angle while keeping your heel on the floor.   SKILLED REHAB INSTRUCTIONS: If the patient is transferred to a skilled rehab facility following release from the hospital, a list of the current medications will be sent to the facility for the patient to continue.  When discharged from the skilled rehab facility, please have the facility set up the patient's Home Health Physical Therapy prior to being released. Also, the skilled facility will be responsible for providing the patient with their medications at time of release from the facility to include their pain medication and their blood thinner medication. If the patient is still at the rehab facility at time of the two week follow up appointment, the skilled rehab facility will also need to assist the patient in arranging follow up appointment in our office and any transportation needs.  MAKE SURE YOU:  Understand these instructions.  Will watch your condition.  Will get help right away if you are not doing well or get worse.  Pick up stool softner and laxative for home use following surgery while on pain medications. Daily dry dressing changes as needed. In 4 days, you may remove your dressings and begin taking showers - no tub baths or soaking the incisions. Continue to use ice for pain and swelling after surgery. Do not use any lotions or creams on the incision until instructed by your surgeon.   

## 2018-12-17 NOTE — Evaluation (Signed)
SLP Cancellation Note  Patient Details Name: DENETTE HASS MRN: [id number] DOB: [birth year]   Cancelled treatment:       Reason Eval/Treat Not Completed: Other (comment);Medical issues which prohibited therapy(pt npo for surgery today, will continue efforts)   Macario Golds 12/17/2018, 7:40 AM   Luanna Salk, MS [facility] SLP Acute Rehab Services Pager [phone] Office [phone]

## 2018-12-17 NOTE — Anesthesia Procedure Notes (Signed)
Spinal  Patient location during procedure: OR Start time: 12/17/2018 12:36 PM End time: 12/17/2018 12:44 PM Staffing Anesthesiologist: Duane Boston, MD Performed: anesthesiologist  Preanesthetic Checklist Completed: patient identified, surgical consent, pre-op evaluation, timeout performed, IV checked, risks and benefits discussed and monitors and equipment checked Spinal Block Patient position: sitting Prep: DuraPrep Patient monitoring: cardiac monitor, continuous pulse ox and blood pressure Approach: right paramedian Location: L2-3 Injection technique: single-shot Needle Needle type: Pencan  Needle gauge: 22 G Needle length: 9 cm Additional Notes Functioning IV was confirmed and monitors were applied. Sterile prep and drape, including hand hygiene and sterile gloves were used. The patient was positioned and the spine was prepped. The skin was anesthetized with lidocaine.  Free flow of clear CSF was obtained prior to injecting local anesthetic into the CSF.  The spinal needle aspirated freely following injection.  The needle was carefully withdrawn.  The patient tolerated the procedure well.

## 2018-12-17 NOTE — Consult Note (Signed)
ORTHOPAEDIC CONSULTATION  REQUESTING PHYSICIAN: Merlene Laughter, DO  PCP:  Lynnea Ferrier, MD  Chief Complaint: Right intertrochanteric femur fracture.  HPI: Jocelyn Gilbert is a 84 y.o. female who lives independently with her husband with 10 hours of daily in-home care.  She had a ground-level fall.  She had hip pain and inability to weight-bear.  She was brought to the emergency department at [facility], where x-rays revealed a displaced right intertrochanteric femur fracture.  She was admitted to the hospitalist service for perioperative or stratification and medical optimization.  Orthopedic consultation was placed for management of right intertrochanteric femur fracture.  History was obtained from the chart and from the patient's daughter, as the patient has severe dementia.  Past Medical History:  Diagnosis Date  . Alzheimer disease (HCC)   . History of TIAs   . Thyroid disease    Past Surgical History:  Procedure Laterality Date  . EYE SURGERY     Social History   Socioeconomic History  . Marital status: Married    Spouse name: Not on file  . Number of children: Not on file  . Years of education: Not on file  . Highest education level: Not on file  Occupational History  . Not on file  Social Needs  . Financial resource strain: Not on file  . Food insecurity    Worry: Not on file    Inability: Not on file  . Transportation needs    Medical: Not on file    Non-medical: Not on file  Tobacco Use  . Smoking status: Former Smoker    Types: Cigarettes  . Smokeless tobacco: Never Used  Substance and Sexual Activity  . Alcohol use: Yes    Comment: occas  . Drug use: No  . Sexual activity: Never  Lifestyle  . Physical activity    Days per week: Not on file    Minutes per session: Not on file  . Stress: Not on file  Relationships  . Social Musician on phone: Not on file    Gets together: Not on file    Attends religious  service: Not on file    Active member of club or organization: Not on file    Attends meetings of clubs or organizations: Not on file    Relationship status: Not on file  Other Topics Concern  . Not on file  Social History Narrative  . Not on file   History reviewed. No pertinent family history. No Known Allergies Prior to Admission medications   Medication Sig Start Date End Date Taking? Authorizing Provider  acetaminophen (TYLENOL) 325 MG tablet Take 325 mg by mouth every 6 (six) hours as needed for headache.   Yes [provider]  Ascorbic Acid (VITAMIN C) POWD Take 500 mg by mouth daily.   Yes [provider]  atorvastatin (LIPITOR) 40 MG tablet Take 40 mg by mouth daily at 6 PM. 06/16/18  Yes [provider]  azelastine (ASTELIN) 0.1 % nasal spray Place 1 spray into both nostrils 2 (two) times daily as needed for rhinitis or allergies.  03/12/18  Yes [provider]  COMBIGAN 0.2-0.5 % ophthalmic solution Place 1 drop into both eyes 2 (two) times daily.  06/26/16  Yes [provider]  Cyanocobalamin (B-12) 2500 MCG TABS Take 2,500 mcg by mouth daily.   Yes [provider]  latanoprost (XALATAN) 0.005 % ophthalmic solution Place 1 drop into both  eyes at bedtime. 07/13/16  Yes [provider]  levothyroxine (SYNTHROID) 112 MCG tablet Take 112 mcg by mouth every morning. 12/14/18  Yes [provider]  mirtazapine (REMERON) 15 MG tablet Take 15 mg by mouth at bedtime. 10/18/18  Yes [provider]  NAMENDA XR 28 MG CP24 24 hr capsule Take 28 mg by mouth daily. 06/19/16  Yes [provider]  OLANZapine (ZYPREXA) 2.5 MG tablet Take 1 tablet (2.5 mg total) by mouth at bedtime as needed (agitation). 11/07/18  Yes Gladstone Lighter, MD  risperiDONE (RISPERDAL) 0.5 MG tablet Take 0.5 mg by mouth daily as needed for anxiety. 11/11/18  Yes [provider]  rivastigmine (EXELON) 13.3 MG/24HR Place 13.3 mg onto the  skin daily. 11/24/18  Yes [provider]  Vitamin D, Ergocalciferol, 50 MCG (2000 UT) CAPS Take 2 capsules by mouth daily.    Yes [provider]  zinc gluconate 50 MG tablet Take 50 mg by mouth daily.   Yes [provider]   Dg Chest 1 View  Result Date: 12/16/2018 CLINICAL DATA:  Fall with hip fracture EXAM: CHEST  1 VIEW COMPARISON:  11/05/2018 FINDINGS: No focal opacity or pleural effusion. Stable cardiomediastinal silhouette with aortic atherosclerosis. No pneumothorax. IMPRESSION: No active disease. Electronically Signed   By: Donavan Foil M.D.   On: 12/16/2018 19:32   Dg Knee Right Port  Result Date: 12/17/2018 CLINICAL DATA:  Known intertrochanteric right femur fracture EXAM: PORTABLE RIGHT KNEE - 1-2 VIEW COMPARISON:  Right hip radiographs from 1 day prior FINDINGS: No right knee fracture or dislocation. No suspicious focal osseous lesions. Minimal lateral compartment osteoarthritis. No radiopaque foreign body. Unable to assess for joint effusion given lack of true lateral view. IMPRESSION: No acute osseous abnormality in the right knee. Minimal right knee lateral compartment osteoarthritis. Electronically Signed   By: Ilona Sorrel M.D.   On: 12/17/2018 08:38   Dg Hip Unilat  With Pelvis 2-3 Views Right  Result Date: 12/16/2018 CLINICAL DATA:  84 year old female fall in kitchen. EXAM: DG HIP (WITH OR WITHOUT PELVIS) 2-3V RIGHT COMPARISON:  03/01/2015 FINDINGS: Marked scoliosis deformity involving the lumbar spine, convex to the left. There is an acute, intertrochanteric fracture involving the proximal right femur. Medial angulation of the distal fracture fragments noted. IMPRESSION: 1. Acute intertrochanteric fracture of the proximal right femur. Electronically Signed   By: Kerby Moors M.D.   On: 12/16/2018 19:31    Positive ROS: All other systems have been reviewed and were otherwise negative with the exception of those mentioned in the HPI and as above.   Physical Exam: General: Alert, no acute distress Cardiovascular: No pedal edema Respiratory: No cyanosis, no use of accessory musculature GI: No organomegaly, abdomen is soft and non-tender Skin: No lesions in the area of chief complaint Neurologic: Sensation intact distally Psychiatric: Patient is competent for consent with normal mood and affect Lymphatic: No axillary or cervical lymphadenopathy  MUSCULOSKELETAL: Examination of the right lower extremity reveals no skin wounds or lesions.  She is shortened and externally rotated.  She has pain with attempted logrolling of the hip.  She has palpable pedal pulses.  She has positive motor function dorsiflexion, plantarflexion, and great toe extension.  I am unable to assess sensory function due to mental status issues.  Assessment: Displaced right intertrochanteric femur fracture. Severe dementia.  Plan: I discussed the findings with the patient and her daughter.  She has a displaced right intertrochanteric femur fracture, and therefore I recommend surgical  stabilization for pain control and to allow her to mobilize out of bed.  We discussed the risk, benefits, and alternatives to intramedullary fixation of the right femur.  Plan for surgery today.  All questions were solicited and answered.   Jonette PesaBrian J Nichael Ehly, MD Cell [phone]    12/17/2018 11:56 AM

## 2018-12-17 NOTE — Anesthesia Preprocedure Evaluation (Addendum)
Anesthesia Evaluation  Patient identified by MRN, date of birth, ID band Patient confused    Reviewed: Allergy & Precautions, NPO status , Patient's Chart, lab work & pertinent test results  History of Anesthesia Complications Negative for: history of anesthetic complications  Airway Mallampati: III  TM Distance: >3 FB     Dental no notable dental hx. (+) Dental Advisory Given   Pulmonary neg pulmonary ROS, former smoker,    Pulmonary exam normal        Cardiovascular negative cardio ROS Normal cardiovascular exam     Neuro/Psych PSYCHIATRIC DISORDERS Dementia TIA   GI/Hepatic negative GI ROS, Neg liver ROS,   Endo/Other  Hypothyroidism   Renal/GU negative Renal ROS     Musculoskeletal negative musculoskeletal ROS (+)   Abdominal   Peds  Hematology negative hematology ROS (+)   Anesthesia Other Findings Day of surgery medications reviewed with the patient.  Reproductive/Obstetrics                            Anesthesia Physical Anesthesia Plan  ASA: IV  Anesthesia Plan: Spinal   Post-op Pain Management:    Induction:   PONV Risk Score and Plan: 2 and Ondansetron and Propofol infusion  Airway Management Planned: Natural Airway  Additional Equipment:   Intra-op Plan:   Post-operative Plan:   Informed Consent: I have reviewed the patients History and Physical, chart, labs and discussed the procedure including the risks, benefits and alternatives for the proposed anesthesia with the patient or authorized representative who has indicated his/her understanding and acceptance.   Patient has DNR.  Discussed DNR with power of attorney and Suspend DNR.   Dental advisory given  Plan Discussed with: CRNA and Anesthesiologist  Anesthesia Plan Comments:        Anesthesia Quick Evaluation

## 2018-12-17 NOTE — Op Note (Signed)
OPERATIVE REPORT  SURGEON: Rod Can, MD   ASSISTANT: Staff.  PREOPERATIVE DIAGNOSIS: Right intertrochanteric femur fracture.   POSTOPERATIVE DIAGNOSIS: Right intertrochanteric femur fracture.   PROCEDURE: Intramedullary fixation, Right femur.   IMPLANTS: Stryker Gamma 3 Hip Fracture Nail, 11 by 180 mm, 130 degrees. 10.5 x 90 mm Hip Fracture Nail Lag Screw. 5 x 37.5 mm distal interlocking screw 1.  ANESTHESIA:  Spinal  ESTIMATED BLOOD LOSS:-50 mL    ANTIBIOTICS: 2 g Ancef.  DRAINS: None.  COMPLICATIONS: None.   CONDITION: PACU - hemodynamically stable.  BRIEF CLINICAL NOTE: Jocelyn Gilbert is a 84 y.o. female who presented with an intertrochanteric femur fracture. The patient was admitted to the hospitalist service and underwent perioperative risk stratification and medical optimization. The risks, benefits, and alternatives to the procedure were explained, and the patient elected to proceed.  PROCEDURE IN DETAIL: Surgical site was marked by myself. The patient was taken to the operating room and anesthesia was induced on the bed. The patient was then transferred to the [facility] table and the nonoperative lower extremity was scissored underneath the operative side. The fracture was reduced with traction, internal rotation, and adduction. The hip was prepped and draped in the normal sterile surgical fashion. Timeout was called verifying side and site of surgery. Preop antibiotics were given with 60 minutes of beginning the procedure.  Fluoroscopy was used to define the patient's anatomy. A 4 cm incision was made just proximal to the tip of the greater trochanter. The awl was used to obtain the standard starting point for a trochanteric entry nail under fluoroscopic control. The guidepin was placed. The entry reamer was used to open the proximal femur.  On the back table, the nail was assembled onto the jig. The nail was placed into the femur without any difficulty. Through a  separate stab incision, the cannula was placed down to the bone in preparation for the cephalomedullary device. A guidepin was placed into the femoral head using AP and lateral fluoroscopy views. The pin was measured, and then reaming was performed to the appropriate depth. The lag screw was inserted to the appropriate depth. The fracture was compressed through the jig. The setscrew was tightened and then loosened one quarter turn. A separate stab incision was created, and the distal interlocking screw was placed using standard AO technique. The jig was removed. Final AP and lateral fluoroscopy views were obtained to confirm fracture reduction and hardware placement. Tip apex distance was appropriate. There was no chondral penetration.  The wounds were copiously irrigated with saline. The wound was closed in layers with #1 Vicryl for the fascia, 2-0 Monocryl for the deep dermal layer, and 3-0 Monocryl subcuticular stitch. Glue was applied to the skin. Once the glue was fully hardened, sterile dressing was applied. The patient was then awakened from anesthesia and taken to the PACU in stable condition. Sponge needle and instrument counts were correct at the end of the case 2. There were no known complications.  We will readmit the patient to the hospitalist. Weightbearing status will be weightbearing as tolerated with a walker. We will begin Lovenox for DVT prophylaxis. The patient will work with physical therapy and undergo disposition planning.

## 2018-12-17 NOTE — Transfer of Care (Signed)
Immediate Anesthesia Transfer of Care Note  Patient: Jocelyn Gilbert  Procedure(s) Performed: INTRAMEDULLARY (IM) NAIL FEMORAL RIGHT (Right Hip)  Patient Location: PACU  Anesthesia Type:Spinal  Level of Consciousness: drowsy  Airway & Oxygen Therapy: Patient Spontanous Breathing and Patient connected to face mask  Post-op Assessment: Report given to RN and Post -op Vital signs reviewed and stable  Post vital signs: Reviewed and stable  Last Vitals:  Vitals Value Taken Time  BP    Temp    Pulse    Resp 12 12/17/18 1411  SpO2    Vitals shown include unvalidated device data.  Last Pain:  Vitals:   12/17/18 1145  TempSrc:   PainSc: 0-No pain      Patients Stated Pain Goal: 3 (00/34/91 7915)  Complications: No apparent anesthesia complications

## 2018-12-18 ENCOUNTER — Encounter (HOSPITAL_COMMUNITY): Payer: Self-pay | Admitting: Orthopedic Surgery

## 2018-12-18 DIAGNOSIS — D649 Anemia, unspecified: Secondary | ICD-10-CM

## 2018-12-18 LAB — BASIC METABOLIC PANEL
Anion gap: 7 (ref 5–15)
BUN: 11 mg/dL (ref 8–23)
CO2: 23 mmol/L (ref 22–32)
Calcium: 8 mg/dL — ABNORMAL LOW (ref 8.9–10.3)
Chloride: 108 mmol/L (ref 98–111)
Creatinine, Ser: 0.68 mg/dL (ref 0.44–1.00)
GFR calc Af Amer: >60 mL/min (ref >60)
GFR calc non Af Amer: >60 mL/min (ref >60)
Glucose, Bld: 112 mg/dL — ABNORMAL HIGH (ref 70–99)
Potassium: 3.6 mmol/L (ref 3.5–5.1)
Sodium: 138 mmol/L (ref 135–145)

## 2018-12-18 LAB — CBC
HCT: 35 % — ABNORMAL LOW (ref 36.0–46.0)
Hemoglobin: 11.6 g/dL — ABNORMAL LOW (ref 12.0–15.0)
MCH: 31.4 pg (ref 26.0–34.0)
MCHC: 33.1 g/dL (ref 30.0–36.0)
MCV: 94.6 fL (ref 80.0–100.0)
Platelets: 152 10*3/uL (ref 150–400)
RBC: 3.7 MIL/uL — ABNORMAL LOW (ref 3.87–5.11)
RDW: 13.3 % (ref 11.5–15.5)
WBC: 9 10*3/uL (ref 4.0–10.5)
nRBC: 0 % (ref 0.0–0.2)

## 2018-12-18 MED ORDER — SODIUM CHLORIDE 0.9 % IV SOLN
INTRAVENOUS | Status: DC | PRN
  Administered 2018-12-18: 03:00:00 1000 mL via INTRAVENOUS

## 2018-12-18 MED ORDER — ASPIRIN EC 81 MG PO TBEC
81.0000 mg | DELAYED_RELEASE_TABLET | Freq: Every day | ORAL | Status: DC
  Administered 2018-12-18 – 2018-12-21 (×4): 81 mg via ORAL
  Filled 2018-12-18 (×4): qty 1

## 2018-12-18 NOTE — Progress Notes (Signed)
Physical Therapy Treatment Patient Details Name: Jocelyn DivineMildred P Benninger MRN: [id number] DOB: 04-14-1934 Today's Date: 12/18/2018    History of Present Illness Pt s/p fall with R hip fx and now s/p IM nailing.  84 year old female with past medical history significant for hypothyroidism, severe Alzheimer's dementia.    PT Comments    Pt generally pleasant and cooperative but requiring increased time for all tasks 2* delayed processing and difficulty following cues 2* dementia.  Husband in room and very appreciative of extra time taken with his wife.    Follow Up Recommendations  SNF     Equipment Recommendations  None recommended by PT    Recommendations for Other Services OT consult     Precautions / Restrictions Precautions Precautions: Fall Restrictions Weight Bearing Restrictions: No RLE Weight Bearing: Weight bearing as tolerated    Mobility  Bed Mobility Overal bed mobility: Needs Assistance Bed Mobility: Sit to Supine     Supine to sit: Max assist;+2 for physical assistance;+2 for safety/equipment Sit to supine: Total assist;+2 for physical assistance;+2 for safety/equipment   General bed mobility comments: INcreased time with min follow through on  cues.  Physical assist to manage LEs and to manage trunk;  Bed Pad used to assist to top and center of bed  Transfers Overall transfer level: Needs assistance Equipment used: Rolling walker (2 wheeled) Transfers: Sit to/from Stand Sit to Stand: Mod assist;Max assist;+2 physical assistance;+2 safety/equipment         General transfer comment: cues for use of UEs; physical assist to bring wt up and fwd and to balance in standing  Ambulation/Gait Ambulation/Gait assistance: Min assist;Mod assist;+2 physical assistance;+2 safety/equipment Gait Distance (Feet): 4 Feet Assistive device: Rolling walker (2 wheeled) Gait Pattern/deviations: Step-to pattern;Step-through pattern;Decreased step length - right;Decreased step  length - left;Shuffle;Trunk flexed;Narrow base of support Gait velocity: decr   General Gait Details: Increased time with physical assist for balance/support and to wt shift to step fwd.  Delayed processing and difficulty comprehending to step bkwd to bed   Stairs             Wheelchair Mobility    Modified Rankin (Stroke Patients Only)       Balance Overall balance assessment: Needs assistance Sitting-balance support: No upper extremity supported;Feet supported Sitting balance-Leahy Scale: Fair     Standing balance support: Bilateral upper extremity supported Standing balance-Leahy Scale: Poor                              Cognition Arousal/Alertness: Awake/alert Behavior During Therapy: WFL for tasks assessed/performed Overall Cognitive Status: Within Functional Limits for tasks assessed                                        Exercises      General Comments        Pertinent Vitals/Pain Pain Assessment: Faces Faces Pain Scale: Hurts little more Pain Location: R hip Pain Intervention(s): Limited activity within patient's tolerance;Monitored during session    Home Living Family/patient expects to be discharged to:: Unsure Living Arrangements: Spouse/significant other             Additional Comments: Pt unable to answer any background questions    Prior Function Level of Independence: Needs assistance  Gait / Transfers Assistance Needed: HHA and gait belt ADL's / Homemaking Assistance Needed: Spouse Comments: Spouse  reports care attendent 10 hrs a day and he assists the rest   PT Goals (current goals can now be found in the care plan section) Acute Rehab PT Goals Patient Stated Goal: Pt unable to state; spouse states back to PLOF so she can be cared for at home ASAP PT Goal Formulation: With family Time For Goal Achievement: 01/01/19 Potential to Achieve Goals: Fair Progress towards PT goals: Progressing toward goals     Frequency    Min 3X/week      PT Plan Current plan remains appropriate    Co-evaluation              AM-PAC PT "6 Clicks" Mobility   Outcome Measure  Help needed turning from your back to your side while in a flat bed without using bedrails?: A Lot Help needed moving from lying on your back to sitting on the side of a flat bed without using bedrails?: A Lot Help needed moving to and from a bed to a chair (including a wheelchair)?: A Lot Help needed standing up from a chair using your arms (e.g., wheelchair or bedside chair)?: A Lot Help needed to walk in hospital room?: A Lot Help needed climbing 3-5 steps with a railing? : A Lot 6 Click Score: 10    End of Session Equipment Utilized During Treatment: Gait belt Activity Tolerance: Patient tolerated treatment well;Patient limited by fatigue Patient left: in bed;with call bell/phone within reach;with bed alarm set;with family/visitor present Nurse Communication: Mobility status PT Visit Diagnosis: Difficulty in walking, not elsewhere classified (R26.2)     Time: 1345-1410 PT Time Calculation (min) (ACUTE ONLY): 25 min  Charges:  $Gait Training: 8-22 mins $Therapeutic Activity: 8-22 mins                     [place] Pager [phone] Office [phone]    Emilianna Barlowe 12/18/2018, 3:22 PM

## 2018-12-18 NOTE — Evaluation (Signed)
Physical Therapy Evaluation Patient Details Name: Jocelyn Gilbert MRN: [id number] DOB: [birth year] Today's Date: 12/18/2018   History of Present Illness  Pt s/p fall with R hip fx and now s/p IM nailing.  84 year old female with past medical history significant for hypothyroidism, severe Alzheimer's dementia.  Clinical Impression  Pt admitted as above and presenting with functional mobility limitations 2* post op pain, decreased R LE strength/ROM, balance deficits and dementia related cognitive deficits.  Pt would benefit from follow up rehab at SNF to maximize IND and safety prior to return home.    Follow Up Recommendations SNF    Equipment Recommendations  None recommended by PT    Recommendations for Other Services OT consult     Precautions / Restrictions Precautions Precautions: Fall Restrictions Weight Bearing Restrictions: No RLE Weight Bearing: Weight bearing as tolerated      Mobility  Bed Mobility Overal bed mobility: Needs Assistance Bed Mobility: Supine to Sit     Supine to sit: Max assist;+2 for physical assistance;+2 for safety/equipment     General bed mobility comments: INcreased time with min follow through on VC but some with tactile cues.  Physical assist to manage LEs and to bring trunk to upright;  Bed Pad used to assist pt to EOB sitting  Transfers Overall transfer level: Needs assistance Equipment used: Rolling walker (2 wheeled) Transfers: Sit to/from Stand Sit to Stand: Mod assist;Max assist;+2 physical assistance;+2 safety/equipment;From elevated surface         General transfer comment: cues for use of UEs; physical assist to bring wt up and fwd and to balance in standing  Ambulation/Gait Ambulation/Gait assistance: Min assist;Mod assist;+2 physical assistance;+2 safety/equipment Gait Distance (Feet): 8 Feet Assistive device: Rolling walker (2 wheeled) Gait Pattern/deviations: Step-to pattern;Step-through pattern;Decreased step length  - right;Decreased step length - left;Shuffle;Trunk flexed;Narrow base of support Gait velocity: decr   General Gait Details: Physical assist for balance/support and to wt shift to step fwd  Stairs            Wheelchair Mobility    Modified Rankin (Stroke Patients Only)       Balance Overall balance assessment: Needs assistance Sitting-balance support: No upper extremity supported;Feet supported Sitting balance-Leahy Scale: Fair     Standing balance support: Bilateral upper extremity supported Standing balance-Leahy Scale: Poor                               Pertinent Vitals/Pain Pain Assessment: Faces Faces Pain Scale: Hurts little more Pain Location: R hip Pain Intervention(s): Limited activity within patient's tolerance;Monitored during session;Ice applied    Home Living Family/patient expects to be discharged to:: Unsure Living Arrangements: Spouse/significant other               Additional Comments: Pt unable to answer any background questions    Prior Function Level of Independence: Needs assistance   Gait / Transfers Assistance Needed: HHA and gait belt  ADL's / Homemaking Assistance Needed: Spouse  Comments: Spouse reports care attendent 10 hrs a day and he assists the rest     Hand Dominance        Extremity/Trunk Assessment   Upper Extremity Assessment Upper Extremity Assessment: Overall WFL for tasks assessed    Lower Extremity Assessment Lower Extremity Assessment: RLE deficits/detail    Cervical / Trunk Assessment Cervical / Trunk Assessment: Kyphotic  Communication   Communication: Expressive difficulties  Cognition Arousal/Alertness: Awake/alert Behavior [facility]  tasks assessed/performed Overall Cognitive Status: Within Functional Limits for tasks assessed                                        General Comments      Exercises     Assessment/Plan    PT Assessment Patient  needs continued PT services  PT Problem List Decreased strength;Decreased range of motion;Decreased activity tolerance;Decreased balance;Decreased mobility;Decreased cognition;Decreased knowledge of use of DME;Pain       PT Treatment Interventions DME instruction;Gait training;Stair training;Functional mobility training;Therapeutic activities;Therapeutic exercise;Balance training;Patient/family education    PT Goals (Current goals can be found in the Care Plan section)  Acute Rehab PT Goals Patient Stated Goal: Pt unable to state; spouse states back to PLOF so she can be cared for at home ASAP PT Goal Formulation: With family Time For Goal Achievement: 01/01/19 Potential to Achieve Goals: Fair    Frequency Min 5X/week   Barriers to discharge        Co-evaluation               AM-PAC PT "6 Clicks" Mobility  Outcome Measure Help needed turning from your back to your side while in a flat bed without using bedrails?: A Lot Help needed moving from lying on your back to sitting on the side of a flat bed without using bedrails?: A Lot Help needed moving to and from a bed to a chair (including a wheelchair)?: A Lot Help needed standing up from a chair using your arms (e.g., wheelchair or bedside chair)?: A Lot Help needed to walk in hospital room?: A Lot Help needed climbing 3-5 steps with a railing? : Total 6 Click Score: 11    End of Session Equipment Utilized During Treatment: Gait belt Activity Tolerance: Patient tolerated treatment well;Patient limited by fatigue Patient left: in chair;with call bell/phone within reach;with chair alarm set Nurse Communication: Mobility status PT Visit Diagnosis: Difficulty in walking, not elsewhere classified (R26.2)    Time: 1045-1110 PT Time Calculation (min) (ACUTE ONLY): 25 min   Charges:   PT Evaluation $PT Eval Low Complexity: 1 Low PT Treatments $Gait Training: 8-22 mins        [place] Pager [phone] Office [phone]   Malynda Smolinski 12/18/2018, 12:56 PM

## 2018-12-18 NOTE — Progress Notes (Signed)
    Subjective:  Patient reports pain as mild to moderate.  Denies N/V/CP/SOB.   Objective:   VITALS:   Vitals:   12/17/18 2140 12/17/18 2307 12/18/18 0312 12/18/18 0531  BP: (!) 95/48 (!) 101/51 (!) 112/52 121/63  Pulse: 74 72 64 69  Resp: 16 16 16 14   Temp:  98.5 F (36.9 C) 97.8 F (36.6 C) 98.9 F (37.2 C)  TempSrc:  Oral  Oral  SpO2: 99% 98% 98% 100%  Weight:      Height:        NAD, sleepy ABD soft Sensation intact distally Intact pulses distally Dorsiflexion/Plantar flexion intact Incision: dressing C/D/I Compartment soft   Lab Results  Component Value Date   WBC 9.0 12/18/2018   HGB 11.6 (L) 12/18/2018   HCT 35.0 (L) 12/18/2018   MCV 94.6 12/18/2018   PLT 152 12/18/2018   BMET    Component Value Date/Time   NA 138 12/18/2018 0306   K 3.6 12/18/2018 0306   CL 108 12/18/2018 0306   CO2 23 12/18/2018 0306   GLUCOSE 112 (H) 12/18/2018 0306   BUN 11 12/18/2018 0306   CREATININE 0.68 12/18/2018 0306   CALCIUM 8.0 (L) 12/18/2018 0306   GFRNONAA >60 12/18/2018 0306   GFRAA >60 12/18/2018 0306     Assessment/Plan: 1 Day Post-Op   Principal Problem:   Closed fracture of right hip (HCC) Active Problems:   Alzheimer disease (HCC)   History of TIAs   Hypothyroidism   WBAT with walker DVT ppx: Lovenox, SCDs, TEDS PO pain control PT/OT Dispo: D/C planning    Hilton Cork Tome Wilson 12/18/2018, 10:21 AM   Rod Can, MD Cell: [phone] [place] is now [facility]  [place] [address], [place], [place] [address] Phone: [phone] [email or web address] Facebook  [employer]

## 2018-12-18 NOTE — Progress Notes (Addendum)
PROGRESS NOTE    Jocelyn Gilbert  QPY:[id number] DOB: [birth year] DOA: 12/16/2018 PCP: Adin Hector, MD   Brief Narrative:  HPI per Dr. Mitzi Hansen on 12/16/2018 Jocelyn Gilbert is a 84 y.o. female with medical history significant for Alzheimer dementia, history of TIAs, and hypothyroidism, now presenting to the emergency department for evaluation of right hip pain and inability to bear weight after a fall at home. She had been in her usual state when she stood up and immediately fell onto her right side, witnessed by home health aide who reports no head strike or LOC. The patient was unable to bear weight since then. She is normally ambulatory at home but not verbal.   ED Course: Upon arrival to the ED, patient is found to be afebrile, saturating mid 90s on room air, and with stable blood pressure.  EKG features a sinus rhythm with left axis deviation and chest x-ray is negative for acute cardiopulmonary disease.  Plain radiographs of the hip demonstrate acute intertrochanteric fracture of the proximal right femur.  Chemistry panel and CBC are unremarkable.  Orthopedic surgery was consulted by the ED physician, plans tentatively for surgical repair on 12/17/2018, and recommends a medical admission.  **Interim History Patient was seen by Orthopedic Surgery and she went for operative Intervention yesterday (12/17/2018).  Postoperatively she is doing well but today she was less responsive and more lethargic.  She was difficult to arouse but push me away and did answer some questions briefly but fell back to sleep.  PT OT will be consulted and orthopedics are recommending weightbearing as tolerated.  Assessment & Plan:   Principal Problem:   Closed fracture of right hip ([place]) Active Problems:   Alzheimer disease ([place])   History of TIAs   Hypothyroidism   Acute Intrertrochanteric Fracture of the Right Proximal Femur s/p IM Fixation of Right Femur POD1 -Presented with right hip pain and  inability to bear weight after a ground level mechanical fall at home; she is found to have acute right intertrochanteric femur fracture on X-Ray -Orthopedic surgery Dr. Doran Durand is consulting and planning for OR on 12/17/18; Dr. Lyla Glassing took the patient for surgical intervention with an IM nailing yesterday on 12/17/2018 - Based on the available data, Jocelyn Gilbert presents an estimated 2% risk of perioperative MI or cardiac arrest  -We held ASA, kept NPO after midnight, continue pain-control, supportive care   -C/w Gentle IVF with 1/2 NS at 90 mL/hr until operative intervention and for only 12 hours and this is now stopped -Further care per orthopedic surgery  -Currently has pain control with methocarbamol 500 mg p.o./IV every 6 as needed for muscle spasm along with IV Morphine 1 to 2 mg every 3 hours for moderate and severe pain  -Continue with antiemetics with tomograms IV ondansetron every 6 PRN for nausea vomiting -Received preoperative antibiotics with cefazolin -X-Ray post operatively showed "Interval intramedullary rod and distal screw fixation of right proximal femoral fracture with expected postsurgical changes." -Orthopedics evaluated today and recommending PT OT to evaluate and treat and weightbearing as tolerated with a walker -Dr. Lyla Glassing recommends DVT prophylaxis with SCDs teds as well as Lovenox -Her aspirin was held on admission but currently I do not see it on the [facility] and will need to verify that she was taking it but she was taking it at the end of July so will resume at 81 mg po Daily today   Dementia with likely Post-Operative Delirium and Lethargy  -  She is A&O x1 at baseline but usually able to ambulate on her own per family  - Continue Memantine 28 mg p.o. daily, Mirtazepine 15 mg p.o. nightly, Rivistigmine 13.3 transdermal patch every 24 daily and as-needed Olanzapine 2.5 mg p.o. nightly as needed agitation    -We will resume her risperidone 0.5 mg p.o. daily as needed for  anxiety eventually but will continue to hold for now she is a little more lethargic today and not as responsive   Hypothyroidism  - Continue Levothyroxine 112 mcg po Daily, dose recently adjusted     History of TIA's  -Currently Held ASA for Surgery but will resume at 81 mg po Daily now  -continue atorvastatin 40 mils p.o. daily   Leukocytosis, improved -Mild. WBC went 9.9 -> 12.2 -> 9.0 -Likely Reactive in the setting of Pain but is now improved  -Continue to Monitor for S/Sx of Infection -Continue to Trend and Repeat CBC in AM   Hyperglycemia -Blood Sugar on Admission was 118 and repeat this AM was 112  -Blood sugars ranging from 112-145 on Daily BMP's -Last HbA1c on 11/05/2018 was 5.1 -Continue to Monitor for the Need of Insulin  HLD -C/w Atrovastatin 40 mg p.o. nightly  Glaucoma -C/w Brimonidine 0.2 % Ophthalmic Solution 1 drop both eyes, Latanoprost 1 drop Both Eyes qHS. And Timolol 1 drop both eyes BID  Vitamin B12 deficiency -Continue with 2500 mcg p.o. daily  Post-Operative Anemia/Normocytic Anemia -Expected Post-Operatiev Drop as Patient's Hgb/Hct went from 14.4/43.0 -> 13.3/40.9 -> 11.6/35.0 -Check Anemia Panel in AM -Continue to Monitor for S/Sx of Bleeding; Currently no overt bleeding noted -Repeat CBC in AM   DVT prophylaxis: Enoxaparin 40 mg sq q2h, SCDs,  Code Status: DO NOT RESUSCITATE  Family Communication: Discussed with daughter at bedside Disposition Plan: Pending further work-up and evaluation by PT OT.  Also needs orthopedic surgery clearance  Consultants:   Orthopedic Surgery Dr. Linna CapriceSwinteck   Procedures:  Displaced right intratrochanteric femur fracture status post operative intervention with IM nailing by orthopedic surgery currently being done   Antimicrobials:  Anti-infectives (From admission, onward)   Start     Dose/Rate Route Frequency Ordered Stop   12/17/18 1900  ceFAZolin (ANCEF) IVPB 2g/100 mL premix     2 g 200 mL/hr over 30  Minutes Intravenous Every 6 hours 12/17/18 1532 12/18/18 0348   12/17/18 1045  ceFAZolin (ANCEF) IVPB 2g/100 mL premix     2 g 200 mL/hr over 30 Minutes Intravenous On call to O.R. 12/17/18 1036 12/17/18 1246     Subjective: Seen and examined at bedside she is more lethargic today and sleeping when I walked in.  She was a little difficult to arouse but then push me away and mumbled something.  She fell back to sleep very quickly.  Likely she has some postoperative delirium and lethargy from her anesthesia.  Husband was at bedside and states that she is just been resting.  Appeared comfortable.  No acute complaints overnight per nursing staff.  Objective: Vitals:   12/17/18 2140 12/17/18 2307 12/18/18 0312 12/18/18 0531  BP: (!) 95/48 (!) 101/51 (!) 112/52 121/63  Pulse: 74 72 64 69  Resp: 16 16 16 14   Temp:  98.5 F (36.9 C) 97.8 F (36.6 C) 98.9 F (37.2 C)  TempSrc:  Oral  Oral  SpO2: 99% 98% 98% 100%  Weight:      Height:        Intake/Output Summary (Last 24 hours) at 12/18/2018 [phone]  Last data filed at 12/18/2018 0600 Gross per 24 hour  Intake 2215.32 ml  Output 1550 ml  Net 665.32 ml   Filed Weights   12/17/18 0207  Weight: 57.8 kg   Examination: Physical Exam:  Constitutional: Thin demented elderly female who is resting and more lethargic and drowsy but in no acute distress Eyes:  Eyes closed during the encounter but lids appeared normal ENMT: External Ears, Nose appear normal.  Neck: Appears normal, supple, no cervical masses, normal ROM, no appreciable thyromegaly Respiratory: Mildly diminished to auscultation bilaterally, no wheezing, rales, rhonchi or crackles. Normal respiratory effort and patient is not tachypenic. No accessory muscle use. Unlabored breathing Cardiovascular: RRR, no murmurs / rubs / gallops. S1 and S2 auscultated.  Abdomen: Soft, non-tender, non-distended. N Bowel sounds positive.  GU: Deferred. Musculoskeletal: No clubbing / cyanosis of  digits/nails. No contractures noted Skin: No rashes, lesions, ulcers on a limited skin evaluation. No induration; Warm and dry.  Neurologic: Appeared drowsy but did push me away.  Psychiatric: Impaired judgment and insight. Not as responsive today and was lethargic and drowsy  Data Reviewed: I have personally reviewed following labs and imaging studies  CBC: Recent Labs  Lab 12/16/18 1930 12/17/18 0230 12/18/18 0306  WBC 9.9 12.2* 9.0  NEUTROABS 7.0  --   --   HGB 14.4 13.3 11.6*  HCT 43.0 40.9 35.0*  MCV 92.7 95.1 94.6  PLT 223 157 152   Basic Metabolic Panel: Recent Labs  Lab 12/16/18 1930 12/17/18 0230 12/18/18 0306  NA 140 138 138  K 4.2 3.8 3.6  CL 105 104 108  CO2 24 23 23   GLUCOSE 118* 145* 112*  BUN 12 11 11   CREATININE 0.73 0.61 0.68  CALCIUM 9.4 8.8* 8.0*   GFR: Estimated Creatinine Clearance: 45.6 mL/min (by C-G formula based on SCr of 0.68 mg/dL). Liver Function Tests: No results for input(s): AST, ALT, ALKPHOS, BILITOT, PROT, ALBUMIN in the last 168 hours. No results for input(s): LIPASE, AMYLASE in the last 168 hours. No results for input(s): AMMONIA in the last 168 hours. Coagulation Profile: Recent Labs  Lab 12/16/18 1930  INR 1.0   Cardiac Enzymes: No results for input(s): CKTOTAL, CKMB, CKMBINDEX, TROPONINI in the last 168 hours. BNP (last 3 results) No results for input(s): PROBNP in the last 8760 hours. HbA1C: No results for input(s): HGBA1C in the last 72 hours. CBG: No results for input(s): GLUCAP in the last 168 hours. Lipid Profile: No results for input(s): CHOL, HDL, LDLCALC, TRIG, CHOLHDL, LDLDIRECT in the last 72 hours. Thyroid Function Tests: No results for input(s): TSH, T4TOTAL, FREET4, T3FREE, THYROIDAB in the last 72 hours. Anemia Panel: No results for input(s): VITAMINB12, FOLATE, FERRITIN, TIBC, IRON, RETICCTPCT in the last 72 hours. Sepsis Labs: No results for input(s): PROCALCITON, LATICACIDVEN in the last 168  hours.  Recent Results (from the past 240 hour(s))  SARS Coronavirus 2 [facility] order, Performed in [facility] hospital lab) Nasopharyngeal Nasopharyngeal Swab     Status: None   Collection Time: 12/16/18  8:37 PM   Specimen: Nasopharyngeal Swab  Result Value Ref Range Status   SARS Coronavirus 2 NEGATIVE NEGATIVE Final    Comment: (NOTE) If result is NEGATIVE SARS-CoV-2 target nucleic acids are NOT DETECTED. The SARS-CoV-2 RNA is generally detectable in upper and lower  respiratory specimens during the acute phase of infection. The lowest  concentration of SARS-CoV-2 viral copies this assay can detect is 250  copies / mL. A negative result does not  preclude SARS-CoV-2 infection  and should not be used as the sole basis for treatment or other  patient management decisions.  A negative result may occur with  improper specimen collection / handling, submission of specimen other  than nasopharyngeal swab, presence of viral mutation(s) within the  areas targeted by this assay, and inadequate number of viral copies  (<250 copies / mL). A negative result must be combined with clinical  observations, patient history, and epidemiological information. If result is POSITIVE SARS-CoV-2 target nucleic acids are DETECTED. The SARS-CoV-2 RNA is generally detectable in upper and lower  respiratory specimens dur ing the acute phase of infection.  Positive  results are indicative of active infection with SARS-CoV-2.  Clinical  correlation with patient history and other diagnostic information is  necessary to determine patient infection status.  Positive results do  not rule out bacterial infection or co-infection with other viruses. If result is PRESUMPTIVE POSTIVE SARS-CoV-2 nucleic acids MAY BE PRESENT.   A presumptive positive result was obtained on the submitted specimen  and confirmed on repeat testing.  While 2019 novel coronavirus  (SARS-CoV-2) nucleic acids may be present in the submitted  sample  additional confirmatory testing may be necessary for epidemiological  and / or clinical management purposes  to differentiate between  SARS-CoV-2 and other Sarbecovirus currently known to infect humans.  If clinically indicated additional testing with an alternate test  methodology [phone]([id number]) is advised. The SARS-CoV-2 RNA is generally  detectable in upper and lower respiratory sp ecimens during the acute  phase of infection. The expected result is Negative. Fact Sheet for Patients:  BoilerBrush.com.cyhttps://www.fda.gov/media/136312/download Fact Sheet for Healthcare Providers: https://pope.com/https://www.fda.gov/media/136313/download This test is not yet approved or cleared by the [place] FDA and has been authorized for detection and/or diagnosis of SARS-CoV-2 by FDA under an Emergency Use Authorization (EUA).  This EUA will remain in effect (meaning this test can be used) for the duration of the COVID-19 declaration under Section 564(b)(1) of the Act, 21 U.S.C. section 360bbb-3(b)(1), unless the authorization is terminated or revoked sooner. Performed at [facility], [address] [place], [place] [address]   Surgical PCR screen     Status: None   Collection Time: 12/17/18 10:04 AM   Specimen: Nasal Mucosa; Nasal Swab  Result Value Ref Range Status   MRSA, PCR NEGATIVE NEGATIVE Final   Staphylococcus aureus NEGATIVE NEGATIVE Final    Comment: (NOTE) The Xpert SA Assay (FDA approved for NASAL specimens in patients 82 years of age and older), is one component of a comprehensive surveillance program. It is not intended to diagnose infection nor to guide or monitor treatment. Performed at [facility], [address] [place], [place] [address]     Radiology Studies: Dg Chest 1 View  Result Date: 12/16/2018 CLINICAL DATA:  Fall with hip fracture EXAM: CHEST  1 VIEW COMPARISON:  11/05/2018 FINDINGS: No focal opacity or pleural effusion. Stable  cardiomediastinal silhouette with aortic atherosclerosis. No pneumothorax. IMPRESSION: No active disease. Electronically Signed   By: Jasmine PangKim  Fujinaga M.D.   On: 12/16/2018 19:32   Pelvis Portable  Result Date: 12/17/2018 CLINICAL DATA:  Post op right hip EXAM: PORTABLE PELVIS 1-2 VIEWS COMPARISON:  02/28/2015, 12/16/2018 FINDINGS: Intramedullary rod and distal screw fixation of the right femur for proximal femoral fracture. Gas in the soft tissues consistent with recent surgery IMPRESSION: Interval intramedullary rod and distal screw fixation of right proximal femoral fracture with expected postsurgical changes. Electronically Signed   By:  Jasmine Pang M.D.   On: 12/17/2018 15:04   Dg Knee Right Port  Result Date: 12/17/2018 CLINICAL DATA:  Known intertrochanteric right femur fracture EXAM: PORTABLE RIGHT KNEE - 1-2 VIEW COMPARISON:  Right hip radiographs from 1 day prior FINDINGS: No right knee fracture or dislocation. No suspicious focal osseous lesions. Minimal lateral compartment osteoarthritis. No radiopaque foreign body. Unable to assess for joint effusion given lack of true lateral view. IMPRESSION: No acute osseous abnormality in the right knee. Minimal right knee lateral compartment osteoarthritis. Electronically Signed   By: Delbert Phenix M.D.   On: 12/17/2018 08:38   Dg C-arm 1-60 Min-no Report  Result Date: 12/17/2018 Fluoroscopy was utilized by the requesting physician.  No radiographic interpretation.   Dg Hip Operative Unilat With Pelvis Right  Result Date: 12/17/2018 CLINICAL DATA:  ORIF of right hip fracture EXAM: OPERATIVE RIGHT HIP WITH PELVIS COMPARISON:  None. FLUOROSCOPY TIME:  Radiation Exposure Index (as provided by the fluoroscopic device): Not available If the device does not provide the exposure index: Fluoroscopy Time:  1 minutes 25 seconds Number of Acquired Images:  4 FINDINGS: Initial images again demonstrate intratrochanteric fracture without significant displacement.  Proximal medullary rod with fixation screw is subsequently seen. The fracture fragments are in near anatomic alignment. IMPRESSION: ORIF of proximal right femoral fracture. Electronically Signed   By: Alcide Clever M.D.   On: 12/17/2018 14:14   Dg Hip Unilat  With Pelvis 2-3 Views Right  Result Date: 12/16/2018 CLINICAL DATA:  84 year old female fall in kitchen. EXAM: DG HIP (WITH OR WITHOUT PELVIS) 2-3V RIGHT COMPARISON:  03/01/2015 FINDINGS: Marked scoliosis deformity involving the lumbar spine, convex to the left. There is an acute, intertrochanteric fracture involving the proximal right femur. Medial angulation of the distal fracture fragments noted. IMPRESSION: 1. Acute intertrochanteric fracture of the proximal right femur. Electronically Signed   By: Signa Kell M.D.   On: 12/16/2018 19:31   Scheduled Meds:  atorvastatin  40 mg Oral q1800   brimonidine  1 drop Both Eyes BID   docusate sodium  100 mg Oral BID   enoxaparin (LOVENOX) injection  40 mg Subcutaneous Q24H   latanoprost  1 drop Both Eyes QHS   levothyroxine  112 mcg Oral Q0600   memantine  28 mg Oral Daily   mirtazapine  15 mg Oral QHS   rivastigmine  13.3 mg Transdermal Daily   senna  1 tablet Oral BID   timolol  1 drop Both Eyes BID   vitamin B-12  2,500 mcg Oral Daily   Continuous Infusions:  sodium chloride 10 mL/hr at 12/18/18 0600    LOS: 2 days   Merlene Laughter, DO [facility] PAGER is on AMION  If 7PM-7AM, please contact night-coverage [email or web address] Password [facility] 12/18/2018, 7:48 AM \

## 2018-12-19 LAB — RETICULOCYTES
Immature Retic Fract: 14.6 % (ref 2.3–15.9)
RBC.: 3.76 MIL/uL — ABNORMAL LOW (ref 3.87–5.11)
Retic Count, Absolute: 43.2 10*3/uL (ref 19.0–186.0)
Retic Ct Pct: 1.2 % (ref 0.4–3.1)

## 2018-12-19 LAB — CBC
HCT: 35.8 % — ABNORMAL LOW (ref 36.0–46.0)
Hemoglobin: 11.6 g/dL — ABNORMAL LOW (ref 12.0–15.0)
MCH: 30.9 pg (ref 26.0–34.0)
MCHC: 32.4 g/dL (ref 30.0–36.0)
MCV: 95.2 fL (ref 80.0–100.0)
Platelets: 161 10*3/uL (ref 150–400)
RBC: 3.76 MIL/uL — ABNORMAL LOW (ref 3.87–5.11)
RDW: 13.4 % (ref 11.5–15.5)
WBC: 9.9 10*3/uL (ref 4.0–10.5)
nRBC: 0 % (ref 0.0–0.2)

## 2018-12-19 LAB — BASIC METABOLIC PANEL
Anion gap: 9 (ref 5–15)
BUN: 10 mg/dL (ref 8–23)
CO2: 27 mmol/L (ref 22–32)
Calcium: 8.8 mg/dL — ABNORMAL LOW (ref 8.9–10.3)
Chloride: 105 mmol/L (ref 98–111)
Creatinine, Ser: 0.54 mg/dL (ref 0.44–1.00)
GFR calc Af Amer: >60 mL/min (ref >60)
GFR calc non Af Amer: >60 mL/min (ref >60)
Glucose, Bld: 101 mg/dL — ABNORMAL HIGH (ref 70–99)
Potassium: 4.1 mmol/L (ref 3.5–5.1)
Sodium: 141 mmol/L (ref 135–145)

## 2018-12-19 LAB — VITAMIN B12: Vitamin B-12: 671 pg/mL (ref 180–914)

## 2018-12-19 LAB — FERRITIN: Ferritin: 154 ng/mL (ref 11–307)

## 2018-12-19 LAB — FOLATE: Folate: 16.9 ng/mL (ref >5.9)

## 2018-12-19 LAB — IRON AND TIBC
Iron: 32 ug/dL (ref 28–170)
Saturation Ratios: 13 % (ref 10.4–31.8)
TIBC: 248 ug/dL — ABNORMAL LOW (ref 250–450)
UIBC: 216 ug/dL

## 2018-12-19 MED ORDER — HYDROCODONE-ACETAMINOPHEN 5-325 MG PO TABS: 1.0000 | ORAL_TABLET | ORAL | 0 refills | Status: AC | PRN

## 2018-12-19 MED ORDER — ENOXAPARIN SODIUM 40 MG/0.4ML ~~LOC~~ SOLN: 40.0000 mg | SUBCUTANEOUS | 0 refills | Status: AC

## 2018-12-19 NOTE — TOC Progression Note (Addendum)
Transition of Care [facility]) - Progression Note    Patient Details  Name: Jocelyn Gilbert MRN: [id number] Date of Birth: 1935-02-16  Transition of [facility]) CM/SW Contact  Leeroy Cha, RN Phone Number: 12/19/2018, 10:29 AM  Clinical Narrative:    tct roy Bruemmer/ asked which snf would he like for his wife to go. Please present a list.  Will do fl2 and call with choices. passar number [phone] A BBU:[phone] fl2 sent out to area snf for consideration. List of accepted snf's given to husband.      Expected Discharge Plan and Services                                                 Social Determinants of Health (SDOH) Interventions    Readmission Risk Interventions No flowsheet data found.

## 2018-12-19 NOTE — [place] FL2 (Signed)
[place] MEDICAID FL2 LEVEL OF CARE SCREENING TOOL     IDENTIFICATION  Patient Name: Jocelyn GuardMildred P Hibner Birthdate: Feb 08, 1935 Sex: female Admission Date (Current Location): 12/16/2018  [facility] [place]   Facility and Address:         Provider Number: [id number]  Attending Physician Name and Address:  Merlene LaughterSheikh, Omair Latif, DO  Relative Name and Phone Number:       Current Level of Care: Hospital Recommended Level of Care: Skilled Nursing Facility Prior Approval Number:    Date Approved/Denied:   PASRR Number: [id number]  Discharge Plan: SNF    Current Diagnoses: Patient Active Problem List   Diagnosis Date Noted  . Closed fracture of right hip (HCC) 12/16/2018  . Alzheimer disease (HCC)   . History of TIAs   . Hypothyroidism   . UTI (urinary tract infection) 11/05/2018  . TIA (transient ischemic attack) 08/07/2016    Orientation RESPIRATION BLADDER Height & Weight     Self  Normal Continent Weight: 57.8 kg Height:  5\' 3"  (160 cm)  BEHAVIORAL SYMPTOMS/MOOD NEUROLOGICAL BOWEL NUTRITION STATUS      Continent Diet(regular)  AMBULATORY STATUS COMMUNICATION OF NEEDS Skin   Extensive Assist(pt x5 weekly) Verbally Normal                       Personal Care Assistance Level of Assistance  Bathing, Feeding, Dressing Bathing Assistance: Limited assistance Feeding assistance: Limited assistance Dressing Assistance: Limited assistance     Functional Limitations Info             SPECIAL CARE FACTORS FREQUENCY  PT (By licensed PT)     PT Frequency: 5 x weekly              Contractures Contractures Info: Not present    Additional Factors Info  Code Status Code Status Info: dnr             Current Medications (12/19/2018):  This is the current hospital active medication list Current Facility-Administered Medications  Medication Dose Route Frequency Provider Last Rate Last Dose  . 0.9 %  sodium chloride infusion    Intravenous PRN Marguerita MerlesSheikh, Omair Latif, DO 10 mL/hr at 12/18/18 1100    . acetaminophen (TYLENOL) tablet 325-650 mg  325-650 mg Oral Q6H PRN Swinteck, Arlys JohnBrian, MD      . aspirin EC tablet 81 mg  81 mg Oral Daily Marguerita MerlesSheikh, Omair Latif, DO   81 mg at 12/19/18 0910  . atorvastatin (LIPITOR) tablet 40 mg  40 mg Oral q1800 Samson FredericSwinteck, Brian, MD   40 mg at 12/18/18 1757  . brimonidine (ALPHAGAN) 0.2 % ophthalmic solution 1 drop  1 drop Both Eyes BID Samson FredericSwinteck, Brian, MD   1 drop at 12/18/18 2122  . docusate sodium (COLACE) capsule 100 mg  100 mg Oral BID Samson FredericSwinteck, Brian, MD   100 mg at 12/18/18 2125  . enoxaparin (LOVENOX) injection 40 mg  40 mg Subcutaneous Q24H Samson FredericSwinteck, Brian, MD   40 mg at 12/19/18 0906  . HYDROcodone-acetaminophen (NORCO) 7.5-325 MG per tablet 1-2 tablet  1-2 tablet Oral Q4H PRN Swinteck, Arlys JohnBrian, MD      . HYDROcodone-acetaminophen (NORCO/VICODIN) 5-325 MG per tablet 1-2 tablet  1-2 tablet Oral Q4H PRN Samson FredericSwinteck, Brian, MD   1 tablet at 12/19/18 0106  . latanoprost (XALATAN) 0.005 % ophthalmic solution 1 drop  1 drop Both Eyes QHS Samson FredericSwinteck, Brian, MD   1 drop at 12/18/18 2125  . levothyroxine (SYNTHROID)  tablet 112 mcg  112 mcg Oral Q0600 Rod Can, MD   112 mcg at 12/19/18 0907  . memantine (NAMENDA XR) 24 hr capsule 28 mg  28 mg Oral Daily Rod Can, MD   28 mg at 12/18/18 0859  . menthol-cetylpyridinium (CEPACOL) lozenge 3 mg  1 lozenge Oral PRN Swinteck, Aaron Edelman, MD       Or  . phenol (CHLORASEPTIC) mouth spray 1 spray  1 spray Mouth/Throat PRN Swinteck, Aaron Edelman, MD      . metoCLOPramide (REGLAN) tablet 5-10 mg  5-10 mg Oral Q8H PRN Swinteck, Aaron Edelman, MD       Or  . metoCLOPramide (REGLAN) injection 5-10 mg  5-10 mg Intravenous Q8H PRN Swinteck, Aaron Edelman, MD      . mirtazapine (REMERON) tablet 15 mg  15 mg Oral QHS Rod Can, MD   15 mg at 12/18/18 2125  . morphine 2 MG/ML injection 0.5-1 mg  0.5-1 mg Intravenous Q2H PRN Swinteck, Aaron Edelman, MD      . OLANZapine (ZYPREXA) tablet 2.5  mg  2.5 mg Oral QHS PRN Rod Can, MD   2.5 mg at 12/19/18 0100  . ondansetron (ZOFRAN) tablet 4 mg  4 mg Oral Q6H PRN Swinteck, Aaron Edelman, MD       Or  . ondansetron (ZOFRAN) injection 4 mg  4 mg Intravenous Q6H PRN Swinteck, Aaron Edelman, MD      . rivastigmine (EXELON) 13.3 MG/24HR 13.3 mg  13.3 mg Transdermal Daily Rod Can, MD   13.3 mg at 12/18/18 0804  . senna (SENOKOT) tablet 8.6 mg  1 tablet Oral BID Rod Can, MD   8.6 mg at 12/18/18 2126  . senna-docusate (Senokot-S) tablet 1 tablet  1 tablet Oral QHS PRN Swinteck, Aaron Edelman, MD      . timolol (TIMOPTIC) 0.5 % ophthalmic solution 1 drop  1 drop Both Eyes BID Rod Can, MD   1 drop at 12/18/18 2122  . vitamin B-12 (CYANOCOBALAMIN) tablet 2,500 mcg  2,500 mcg Oral Daily Rod Can, MD   2,500 mcg at 12/18/18 0900     Discharge Medications: Please see discharge summary for a list of discharge medications.  Relevant Imaging Results:  Relevant Lab Results:   Additional Information [id number]  Leeroy Cha, RN

## 2018-12-19 NOTE — Care Management Important Message (Signed)
Important Message  Patient Details IM Letter given to Velva Harman RN to present to the Patient Name: NAAVA JANEWAY MRN: [id number] Date of Birth: 1935-02-09   Medicare Important Message Given:  Yes     Kerin Salen 12/19/2018, 11:28 AM

## 2018-12-19 NOTE — Plan of Care (Signed)
Care plan reviewed.  The patient has significant cognitive issues that prevent her from meeting certain goals. She has difficulty understanding and retaining any new information.

## 2018-12-19 NOTE — Progress Notes (Signed)
OT Cancellation Note  Patient Details Name: Jocelyn Gilbert MRN: [id number] DOB: 07/15/1934   Cancelled Treatment:    Reason Eval/Treat Not Completed: Other (comment).  Noted plan is SNF.  OT will defer evaluation to that venue (unless plan changes, then we will see her here)  [facility] 12/19/2018, 12:24 PM  Lesle Chris, OTR/L Acute Rehabilitation Services [phone] [facility] pager [phone] office 12/19/2018

## 2018-12-19 NOTE — Progress Notes (Signed)
PROGRESS NOTE    Jocelyn GuardMildred P Gilbert  JXB:[id number] DOB: Apr 09, 1935 DOA: 12/16/2018 PCP: Lynnea FerrierKlein, Jocelyn J III, MD   Brief Narrative:  HPI per Dr. Odie Seraimothy Opyd on 12/16/2018 Jocelyn Gilbert is a 84 y.o. female with medical history significant for Alzheimer dementia, history of TIAs, and hypothyroidism, now presenting to the emergency department for evaluation of right hip pain and inability to bear weight after a fall at home. She had been in her usual state when she stood up and immediately fell onto her right side, witnessed by home health aide who reports no head strike or LOC. The patient was unable to bear weight since then. She is normally ambulatory at home but not verbal.   ED Course: Upon arrival to the ED, patient is found to be afebrile, saturating mid 90s on room air, and with stable blood pressure.  EKG features a sinus rhythm with left axis deviation and chest x-ray is negative for acute cardiopulmonary disease.  Plain radiographs of the hip demonstrate acute intertrochanteric fracture of the proximal right femur.  Chemistry panel and CBC are unremarkable.  Orthopedic surgery was consulted by the ED physician, plans tentatively for surgical repair on 12/17/2018, and recommends a medical admission.  **Interim History Patient was seen by Orthopedic Surgery and she went for operative Intervention (12/17/2018).  Postoperatively she is doing well but yesterday she was less responsive and more lethargic.  She was a little difficult to arouse but did push me away and did answer some questions briefly but fell back to sleep.  PT/OT was consulted and recommending SNF and Orthopedics are recommending weightbearing as tolerated.  This morning she is awake and she seems a little agitated and pleasantly demented due to her cognitive issues and was refusing to swallow her pills.  Assessment & Plan:   Principal Problem:   Closed fracture of right hip (HCC) Active Problems:   Alzheimer disease (HCC)  History of TIAs   Hypothyroidism   Acute Intrertrochanteric Fracture of the Right Proximal Femur s/p IM Fixation of Right Femur POD2 -Presented with right hip pain and inability to bear weight after a ground level mechanical fall at home; she is found to have acute right intertrochanteric femur fracture on X-Ray -Orthopedic surgery Dr. Victorino DikeHewitt is consulting and planning for OR on 12/17/18; Dr. Linna CapriceSwinteck took the patient for surgical intervention with an IM nailing on 12/17/2018 - Based on the available data, Mrs. Jocelyn Gilbert presents an estimated 2% risk of perioperative MI or cardiac arrest  -We held ASA, kept NPO after midnight, continue pain-control, supportive care   -Continued Gentle IVF with 1/2 NS at 90 mL/hr until operative intervention and for only 12 hours and this is now stopped -Further care per orthopedic surgery  -Currently has pain control with methocarbamol 500 mg p.o./IV every 6 as needed for muscle spasm along with IV Morphine 1 to 2 mg every 3 hours for moderate and severe pain  -Continue with antiemetics with tomograms IV ondansetron every 6 PRN for nausea vomiting -Received preoperative antibiotics with cefazolin -X-Ray post operatively showed "Interval intramedullary rod and distal screw fixation of right proximal femoral fracture with expected postsurgical changes." -Orthopedics evaluated today and recommending PT OT to evaluate and treat and weightbearing as tolerated with a walker -Dr. Linna CapriceSwinteck recommends DVT prophylaxis with SCDs teds as well as Lovenox -Her aspirin was held on admission but currently I do not see it on the [facility] and will need to verify that she was taking it but it appears  she was taking it at the end of July so will resumed at 81 mg po Daily  -PT OT recommending skilled nursing facility and have reached out to the transition of care team for assistance with placement  Dementia with likely Post-Operative Delirium and Lethargy  - She is A&O x1 at baseline but  usually able to ambulate on her own per family  - Continue Memantine 28 mg p.o. daily, Mirtazepine 15 mg p.o. nightly, Rivistigmine 13.3 transdermal patch every 24 daily and as-needed Olanzapine 2.5 mg p.o. nightly as needed agitation    -We will resume her Risperidone 0.5 mg p.o. daily as needed for anxiety eventually but will continue to hold for now she is a little more lethargic yesterday and not as responsive; Today she is more awake and alert but because of her cognitive issues from her dementia she is refusing to swallow her pills  Hypothyroidism  - Continue Levothyroxine 112 mcg po Daily, dose recently adjusted     History of TIA's  -Currently Held ASA for Surgery but will resume at 81 mg po Daily now  -continue atorvastatin 40 mils p.o. daily   Leukocytosis, improved -Mild. WBC went 9.9 -> 12.2 -> 9.0 -> 9.9 -Likely Reactive in the setting of Pain but is now improved  -Continue to Monitor for S/Sx of Infection -Continue to Trend and Repeat CBC in AM   Hyperglycemia -Blood Sugar on Admission was 118 and repeat this AM was 101  -Blood sugars ranging from 101-145 on Daily BMP's -Last HbA1c on 11/05/2018 was 5.1 -Continue to Monitor for the Need of Insulin  HLD -C/w Atrovastatin 40 mg p.o. nightly  Glaucoma -C/w Brimonidine 0.2 % Ophthalmic Solution 1 drop both eyes, Latanoprost 1 drop Both Eyes qHS. And Timolol 1 drop both eyes BID  Vitamin B12 deficiency -Continue with 2500 mcg p.o. daily  Post-Operative Anemia/Normocytic Anemia -Expected Post-Operatiev Drop as Patient's Hgb/Hct went from 14.4/43.0 -> 13.3/40.9 -> 11.6/35.0 -> 11.6/35.8 and is now stable -Check Anemia Panel and is pending this AM -Continue to Monitor for S/Sx of Bleeding; Currently no overt bleeding noted -Repeat CBC in AM   DVT prophylaxis: Enoxaparin 40 mg sq q2h, SCDs,  Code Status: DO NOT RESUSCITATE  Family Communication: Discussed with daughter at bedside Disposition Plan: SNF when medically  stable   Consultants:   Orthopedic Surgery Dr. Linna Caprice   Procedures:  Displaced right intratrochanteric femur fracture status post operative intervention with IM nailing by orthopedic surgery currently being done   Antimicrobials:  Anti-infectives (From admission, onward)   Start     Dose/Rate Route Frequency Ordered Stop   12/17/18 1900  ceFAZolin (ANCEF) IVPB 2g/100 mL premix     2 g 200 mL/hr over 30 Minutes Intravenous Every 6 hours 12/17/18 1532 12/18/18 0348   12/17/18 1045  ceFAZolin (ANCEF) IVPB 2g/100 mL premix     2 g 200 mL/hr over 30 Minutes Intravenous On call to O.R. 12/17/18 1036 12/17/18 1246     Subjective: Seen and examined at bedside she is more alert nor agitated.  She is pocketing her pills and refused to swallow.  I asked her if she was in any pain she states "no".  No other concerns or complaints at this time and no family currently at bedside.  PT recommending SNF so we will reach out to the transition care team for assistance with placement.  Objective: Vitals:   12/18/18 1032 12/18/18 1502 12/18/18 2157 12/19/18 0609  BP: 127/79 128/74 103/61 (!) 118/59  Pulse: (!) 104 83 77 64  Resp: 16 16 16 16   Temp: 98.5 F (36.9 C) 98.1 F (36.7 C) 98.1 F (36.7 C) 98.4 F (36.9 C)  TempSrc: Oral Oral Oral   SpO2:  98% 95% 97%  Weight:      Height:        Intake/Output Summary (Last 24 hours) at 12/19/2018 0805 Last data filed at 12/19/2018 0600 Gross per 24 hour  Intake 450.02 ml  Output 1050 ml  Net -599.98 ml   Filed Weights   12/17/18 0207  Weight: 57.8 kg   Examination: Physical Exam:  Constitutional: Thin demented elderly female who is awake and slightly agitated but in NAD  Eyes:  Lids and conjunctivae normal, sclerae anicteric  ENMT: External Ears, Nose appear normal. Grossly normal hearing. Mucous membranes are moist.  Neck: Appears normal, supple, no cervical masses, normal ROM, no appreciable thyromegaly; no JVD Respiratory: Slightly  Diminished to auscultation bilaterally, no wheezing, rales, rhonchi or crackles. Normal respiratory effort and patient is not tachypenic. No accessory muscle use.  Cardiovascular: RRR, no murmurs / rubs / gallops. S1 and S2 auscultated. No extremity edema.  Abdomen: Soft, non-tender, non-distended. Bowel sounds positive.  GU: Deferred. Musculoskeletal: No clubbing / cyanosis of digits/nails. No joint deformity upper and lower extremities.  Skin: No rashes, lesions, ulcers on a limited skin evaluation and Hip incisions appear C/D/I. No induration; Warm and dry.  Neurologic: Does not follow commands very much. Romberg sign cerebellar reflexes not assessed.  Psychiatric: Impaired judgment and insight. Alert and awake.  Slightly agitated mood and appropriate affect.    Data Reviewed: I have personally reviewed following labs and imaging studies  CBC: Recent Labs  Lab 12/16/18 1930 12/17/18 0230 12/18/18 0306 12/19/18 0637  WBC 9.9 12.2* 9.0 9.9  NEUTROABS 7.0  --   --   --   HGB 14.4 13.3 11.6* 11.6*  HCT 43.0 40.9 35.0* 35.8*  MCV 92.7 95.1 94.6 95.2  PLT 223 157 152 237   Basic Metabolic Panel: Recent Labs  Lab 12/16/18 1930 12/17/18 0230 12/18/18 0306 12/19/18 0637  NA 140 138 138 141  K 4.2 3.8 3.6 4.1  CL 105 104 108 105  CO2 24 23 23 27   GLUCOSE 118* 145* 112* 101*  BUN 12 11 11 10   CREATININE 0.73 0.61 0.68 0.54  CALCIUM 9.4 8.8* 8.0* 8.8*   GFR: Estimated Creatinine Clearance: 45.6 mL/min (by C-G formula based on SCr of 0.54 mg/dL). Liver Function Tests: No results for input(s): AST, ALT, ALKPHOS, BILITOT, PROT, ALBUMIN in the last 168 hours. No results for input(s): LIPASE, AMYLASE in the last 168 hours. No results for input(s): AMMONIA in the last 168 hours. Coagulation Profile: Recent Labs  Lab 12/16/18 1930  INR 1.0   Cardiac Enzymes: No results for input(s): CKTOTAL, CKMB, CKMBINDEX, TROPONINI in the last 168 hours. BNP (last 3 results) No results for  input(s): PROBNP in the last 8760 hours. HbA1C: No results for input(s): HGBA1C in the last 72 hours. CBG: No results for input(s): GLUCAP in the last 168 hours. Lipid Profile: No results for input(s): CHOL, HDL, LDLCALC, TRIG, CHOLHDL, LDLDIRECT in the last 72 hours. Thyroid Function Tests: No results for input(s): TSH, T4TOTAL, FREET4, T3FREE, THYROIDAB in the last 72 hours. Anemia Panel: No results for input(s): VITAMINB12, FOLATE, FERRITIN, TIBC, IRON, RETICCTPCT in the last 72 hours. Sepsis Labs: No results for input(s): PROCALCITON, LATICACIDVEN in the last 168 hours.  Recent Results (from the past  240 hour(s))  SARS Coronavirus 2 [facility] order, Performed in [facility] hospital lab) Nasopharyngeal Nasopharyngeal Swab     Status: None   Collection Time: 12/16/18  8:37 PM   Specimen: Nasopharyngeal Swab  Result Value Ref Range Status   SARS Coronavirus 2 NEGATIVE NEGATIVE Final    Comment: (NOTE) If result is NEGATIVE SARS-CoV-2 target nucleic acids are NOT DETECTED. The SARS-CoV-2 RNA is generally detectable in upper and lower  respiratory specimens during the acute phase of infection. The lowest  concentration of SARS-CoV-2 viral copies this assay can detect is 250  copies / mL. A negative result does not preclude SARS-CoV-2 infection  and should not be used as the sole basis for treatment or other  patient management decisions.  A negative result may occur with  improper specimen collection / handling, submission of specimen other  than nasopharyngeal swab, presence of viral mutation(s) within the  areas targeted by this assay, and inadequate number of viral copies  (<250 copies / mL). A negative result must be combined with clinical  observations, patient history, and epidemiological information. If result is POSITIVE SARS-CoV-2 target nucleic acids are DETECTED. The SARS-CoV-2 RNA is generally detectable in upper and lower  respiratory specimens dur ing the acute  phase of infection.  Positive  results are indicative of active infection with SARS-CoV-2.  Clinical  correlation with patient history and other diagnostic information is  necessary to determine patient infection status.  Positive results do  not rule out bacterial infection or co-infection with other viruses. If result is PRESUMPTIVE POSTIVE SARS-CoV-2 nucleic acids MAY BE PRESENT.   A presumptive positive result was obtained on the submitted specimen  and confirmed on repeat testing.  While 2019 novel coronavirus  (SARS-CoV-2) nucleic acids may be present in the submitted sample  additional confirmatory testing may be necessary for epidemiological  and / or clinical management purposes  to differentiate between  SARS-CoV-2 and other Sarbecovirus currently known to infect humans.  If clinically indicated additional testing with an alternate test  methodology [phone]) is advised. The SARS-CoV-2 RNA is generally  detectable in upper and lower respiratory sp ecimens during the acute  phase of infection. The expected result is Negative. Fact Sheet for Patients:  BoilerBrush.com.cy Fact Sheet for Healthcare Providers: https://pope.com/ This test is not yet approved or cleared by the [place] FDA and has been authorized for detection and/or diagnosis of SARS-CoV-2 by FDA under an Emergency Use Authorization (EUA).  This EUA will remain in effect (meaning this test can be used) for the duration of the COVID-19 declaration under Section 564(b)(1) of the Act, 21 U.S.C. section 360bbb-3(b)(1), unless the authorization is terminated or revoked sooner. Performed at [facility], [address], [place], [place] [address]   Surgical PCR screen     Status: None   Collection Time: 12/17/18 10:04 AM   Specimen: Nasal Mucosa; Nasal Swab  Result Value Ref Range Status   MRSA, PCR NEGATIVE NEGATIVE Final   Staphylococcus  aureus NEGATIVE NEGATIVE Final    Comment: (NOTE) The Xpert SA Assay (FDA approved for NASAL specimens in patients 63 years of age and older), is one component of a comprehensive surveillance program. It is not intended to diagnose infection nor to guide or monitor treatment. Performed at [facility], [address] [place], [place] [address]     Radiology Studies: Pelvis Portable  Result Date: 12/17/2018 CLINICAL DATA:  Post op right hip EXAM: PORTABLE PELVIS 1-2 VIEWS  COMPARISON:  02/28/2015, 12/16/2018 FINDINGS: Intramedullary rod and distal screw fixation of the right femur for proximal femoral fracture. Gas in the soft tissues consistent with recent surgery IMPRESSION: Interval intramedullary rod and distal screw fixation of right proximal femoral fracture with expected postsurgical changes. Electronically Signed   By: Jasmine PangKim  Fujinaga M.D.   On: 12/17/2018 15:04   Dg Knee Right Port  Result Date: 12/17/2018 CLINICAL DATA:  Known intertrochanteric right femur fracture EXAM: PORTABLE RIGHT KNEE - 1-2 VIEW COMPARISON:  Right hip radiographs from 1 day prior FINDINGS: No right knee fracture or dislocation. No suspicious focal osseous lesions. Minimal lateral compartment osteoarthritis. No radiopaque foreign body. Unable to assess for joint effusion given lack of true lateral view. IMPRESSION: No acute osseous abnormality in the right knee. Minimal right knee lateral compartment osteoarthritis. Electronically Signed   By: Delbert PhenixJason A Poff M.D.   On: 12/17/2018 08:38   Dg C-arm 1-60 Min-no Report  Result Date: 12/17/2018 Fluoroscopy was utilized by the requesting physician.  No radiographic interpretation.   Dg Hip Operative Unilat With Pelvis Right  Result Date: 12/17/2018 CLINICAL DATA:  ORIF of right hip fracture EXAM: OPERATIVE RIGHT HIP WITH PELVIS COMPARISON:  None. FLUOROSCOPY TIME:  Radiation Exposure Index (as provided by the fluoroscopic device): Not available If the  device does not provide the exposure index: Fluoroscopy Time:  1 minutes 25 seconds Number of Acquired Images:  4 FINDINGS: Initial images again demonstrate intratrochanteric fracture without significant displacement. Proximal medullary rod with fixation screw is subsequently seen. The fracture fragments are in near anatomic alignment. IMPRESSION: ORIF of proximal right femoral fracture. Electronically Signed   By: Alcide CleverMark  Lukens M.D.   On: 12/17/2018 14:14   Scheduled Meds: . aspirin EC  81 mg Oral Daily  . atorvastatin  40 mg Oral q1800  . brimonidine  1 drop Both Eyes BID  . docusate sodium  100 mg Oral BID  . enoxaparin (LOVENOX) injection  40 mg Subcutaneous Q24H  . latanoprost  1 drop Both Eyes QHS  . levothyroxine  112 mcg Oral Q0600  . memantine  28 mg Oral Daily  . mirtazapine  15 mg Oral QHS  . rivastigmine  13.3 mg Transdermal Daily  . senna  1 tablet Oral BID  . timolol  1 drop Both Eyes BID  . vitamin B-12  2,500 mcg Oral Daily   Continuous Infusions: . sodium chloride 10 mL/hr at 12/18/18 1100    LOS: 3 days   Merlene Laughtermair Latif Sheikh, DO [facility] PAGER is on AMION  If 7PM-7AM, please contact night-coverage [email or web address] Password [email or web address] 12/19/2018, 8:05 AM \

## 2018-12-19 NOTE — Progress Notes (Signed)
    Subjective:  Patient reports pain as mild to moderate.  Denies N/V/CP/SOB.   Objective:   VITALS:   Vitals:   12/18/18 1032 12/18/18 1502 12/18/18 2157 12/19/18 0609  BP: 127/79 128/74 103/61 (!) 118/59  Pulse: (!) 104 83 77 64  Resp: 16 16 16 16   Temp: 98.5 F (36.9 C) 98.1 F (36.7 C) 98.1 F (36.7 C) 98.4 F (36.9 C)  TempSrc: Oral Oral Oral   SpO2:  98% 95% 97%  Weight:      Height:        NAD, more awake today ABD soft Sensation intact distally Intact pulses distally Dorsiflexion/Plantar flexion intact Incision: dressing C/D/I Compartment soft   Lab Results  Component Value Date   WBC 9.9 12/19/2018   HGB 11.6 (L) 12/19/2018   HCT 35.8 (L) 12/19/2018   MCV 95.2 12/19/2018   PLT 161 12/19/2018   BMET    Component Value Date/Time   NA 141 12/19/2018 0637   K 4.1 12/19/2018 0637   CL 105 12/19/2018 0637   CO2 27 12/19/2018 0637   GLUCOSE 101 (H) 12/19/2018 0637   BUN 10 12/19/2018 0637   CREATININE 0.54 12/19/2018 0637   CALCIUM 8.8 (L) 12/19/2018 0637   GFRNONAA >60 12/19/2018 0637   GFRAA >60 12/19/2018 5638     Assessment/Plan: 2 Days Post-Op   Principal Problem:   Closed fracture of right hip (HCC) Active Problems:   Alzheimer disease (HCC)   History of TIAs   Hypothyroidism   WBAT with walker DVT ppx: Lovenox, SCDs, TEDS PO pain control PT/OT Dispo: D/C planning    Hilton Cork Haruye Lainez 12/19/2018, 12:02 PM   Rod Can, MD Cell: [phone] [facility]  [place] [address], [place], [place] [address] Phone: [phone] [email or web address] Facebook  [employer]

## 2018-12-20 LAB — CBC WITH DIFFERENTIAL/PLATELET
Abs Immature Granulocytes: 0.05 10*3/uL (ref 0.00–0.07)
Basophils Absolute: 0 10*3/uL (ref 0.0–0.1)
Basophils Relative: 1 %
Eosinophils Absolute: 0.2 10*3/uL (ref 0.0–0.5)
Eosinophils Relative: 2 %
HCT: 35.7 % — ABNORMAL LOW (ref 36.0–46.0)
Hemoglobin: 11.5 g/dL — ABNORMAL LOW (ref 12.0–15.0)
Immature Granulocytes: 1 %
Lymphocytes Relative: 30 %
Lymphs Abs: 2.5 10*3/uL (ref 0.7–4.0)
MCH: 30.6 pg (ref 26.0–34.0)
MCHC: 32.2 g/dL (ref 30.0–36.0)
MCV: 94.9 fL (ref 80.0–100.0)
Monocytes Absolute: 0.9 10*3/uL (ref 0.1–1.0)
Monocytes Relative: 11 %
Neutro Abs: 4.6 10*3/uL (ref 1.7–7.7)
Neutrophils Relative %: 55 %
Platelets: 192 10*3/uL (ref 150–400)
RBC: 3.76 MIL/uL — ABNORMAL LOW (ref 3.87–5.11)
RDW: 13.4 % (ref 11.5–15.5)
WBC: 8.3 10*3/uL (ref 4.0–10.5)
nRBC: 0 % (ref 0.0–0.2)

## 2018-12-20 LAB — COMPREHENSIVE METABOLIC PANEL
ALT: 15 U/L (ref 0–44)
AST: 23 U/L (ref 15–41)
Albumin: 3.2 g/dL — ABNORMAL LOW (ref 3.5–5.0)
Alkaline Phosphatase: 68 U/L (ref 38–126)
Anion gap: 8 (ref 5–15)
BUN: 9 mg/dL (ref 8–23)
CO2: 27 mmol/L (ref 22–32)
Calcium: 9.1 mg/dL (ref 8.9–10.3)
Chloride: 109 mmol/L (ref 98–111)
Creatinine, Ser: 0.66 mg/dL (ref 0.44–1.00)
GFR calc Af Amer: >60 mL/min (ref >60)
GFR calc non Af Amer: >60 mL/min (ref >60)
Glucose, Bld: 102 mg/dL — ABNORMAL HIGH (ref 70–99)
Potassium: 4.6 mmol/L (ref 3.5–5.1)
Sodium: 144 mmol/L (ref 135–145)
Total Bilirubin: 1 mg/dL (ref 0.3–1.2)
Total Protein: 6.4 g/dL — ABNORMAL LOW (ref 6.5–8.1)

## 2018-12-20 LAB — PHOSPHORUS: Phosphorus: 3.1 mg/dL (ref 2.5–4.6)

## 2018-12-20 LAB — MAGNESIUM: Magnesium: 2.1 mg/dL (ref 1.7–2.4)

## 2018-12-20 NOTE — Progress Notes (Signed)
PROGRESS NOTE    Jocelyn Gilbert  [id number] DOB: August 02, 1934 DOA: 12/16/2018 PCP: Jocelyn Gilbert   Brief Narrative:  HPI per Jocelyn Gilbert on 12/16/2018 Jocelyn Gilbert is a 84 y.o. female with medical history significant for Alzheimer dementia, history of TIAs, and hypothyroidism, now presenting to the emergency department for evaluation of right hip pain and inability to bear weight after a fall at home. She had been in her usual state when she stood up and immediately fell onto her right side, witnessed by home health aide who reports no head strike or LOC. The patient was unable to bear weight since then. She is normally ambulatory at home but not verbal.   ED Course: Upon arrival to the ED, patient is found to be afebrile, saturating mid 90s on room air, and with stable blood pressure.  EKG features a sinus rhythm with left axis deviation and chest x-ray is negative for acute cardiopulmonary disease.  Plain radiographs of the hip demonstrate acute intertrochanteric fracture of the proximal right femur.  Chemistry panel and CBC are unremarkable.  Orthopedic surgery was consulted by the ED physician, plans tentatively for surgical repair on 12/17/2018, and recommends a medical admission.  **Interim History Patient was seen by Orthopedic Surgery and she went for operative Intervention (12/17/2018).  Postoperatively she is doing well but yesterday she was less responsive and more lethargic.  She was a little difficult to arouse but did push me away and did answer some questions briefly but fell back to sleep.  PT/OT was consulted and recommending SNF and Orthopedics are recommending weightbearing as tolerated.  She is improved and more is more awake today.  SNF is requesting a repeat cover test which is still pending.  She is stable for medical perspective to be discharged and will be discharged once cover test is resulted and likely this will be in the next 24 hours.  Assessment &  Plan:   Principal Problem:   Closed fracture of right hip (HCC) Active Problems:   Alzheimer disease (HCC)   History of TIAs   Hypothyroidism   Acute Intrertrochanteric Fracture of the Right Proximal Femur s/p IM Fixation of Right Femur POD3 -Presented with right hip pain and inability to bear weight after a ground level mechanical fall at home; she is found to have acute right intertrochanteric femur fracture on X-Ray -Orthopedic surgery Jocelyn Gilbert is consulting and planning for OR on 12/17/18; Jocelyn Gilbert took the patient for surgical intervention with an IM nailing on 12/17/2018 - Based on the available data, Jocelyn Gilbert presents an estimated 2% risk of perioperative MI or cardiac arrest  -We held ASA, kept NPO after midnight, continue pain-control, supportive care   -Continued Gentle IVF with 1/2 NS at 90 mL/hr until operative intervention and for only 12 hours and this is now stopped -Further care per orthopedic surgery  -Currently has pain control with methocarbamol 500 mg p.o./IV every 6 as needed for muscle spasm along with IV Morphine 1 to 2 mg every 3 hours for moderate and severe pain  -Continue with antiemetics with tomograms IV ondansetron every 6 PRN for nausea vomiting -Received preoperative antibiotics with cefazolin -X-Ray post operatively showed "Interval intramedullary rod and distal screw fixation of right proximal femoral fracture with expected postsurgical changes." -Orthopedics evaluated today and recommending PT OT to evaluate and treat and weightbearing as tolerated with a walker -Jocelyn Gilbert recommends DVT prophylaxis with SCDs teds as well as Lovenox -Her aspirin was  held on admission but currently I do not see it on the [facility] and will need to verify that she was taking it but it appears she was taking it at the end of July so will resumed at 81 mg po Daily  -PT OT recommending skilled nursing facility and have reached out to the transition of care team for assistance  with placement -Patient has a bed offer currently and needs a repeat COVID test prior to discharge.  This is been ordered and still pending.  Likely patient is to be discharged in the a.m. once that has resulted.  Dementia with likely Post-Operative Delirium and Lethargy  - She is A&O x1 at baseline but usually able to ambulate on her own per family  - Continue Memantine 28 mg p.o. daily, Mirtazepine 15 mg p.o. nightly, Rivistigmine 13.3 transdermal patch every 24 daily and as-needed Olanzapine 2.5 mg p.o. nightly as needed agitation    -We will resume her Risperidone 0.5 mg p.o. daily as needed for anxiety eventually but will continue to hold for now she is a little more lethargic yesterday and not as responsive; Today she is more awake and alert but because of her cognitive issues from her dementia she is refusing to swallow her pills  Hypothyroidism  - Continue Levothyroxine 112 mcg po Daily, dose recently adjusted     History of TIA's  -Currently Held ASA for Surgery but will resume at 81 mg po Daily now  -continue atorvastatin 40 mils p.o. daily   Leukocytosis, improved -Mild. WBC went 9.9 -> 12.2 -> 9.0 -> 9.9 -> 8.3 -Likely Reactive in the setting of Pain but is now improved  -Continue to Monitor for S/Sx of Infection -Continue to Trend and Repeat CBC in AM   Hyperglycemia -Blood Sugar on Admission was 118 and repeat this AM was 102 -Blood sugars ranging from 101-145 on Daily BMP's -Last HbA1c on 11/05/2018 was 5.1 -Continue to Monitor for the Need of Insulin  HLD -C/w Atrovastatin 40 mg p.o. nightly  Glaucoma -C/w Brimonidine 0.2 % Ophthalmic Solution 1 drop both eyes, Latanoprost 1 drop Both Eyes qHS. And Timolol 1 drop both eyes BID  Vitamin B12 Deficiency -Continue with 2500 mcg p.o. daily  Post-Operative Anemia/Normocytic Anemia -Expected Post-Operatiev Drop as Patient's Hgb/Hct went from 14.4/43.0 -> 13.3/40.9 -> 11.6/35.0 -> 11.6/35.8 -> 11.5/35.7 -Checked Anemia  Panel and showed an iron level of 32, U IBC of 216, TIBC of 248, Saturation Ratios of 13%, ferritin 154, folate of 16.9, vitamin B12 of 671 -Continue to Monitor for S/Sx of Bleeding; Currently no overt bleeding noted -Repeat CBC in AM   DVT prophylaxis: Enoxaparin 40 mg sq q2h, SCDs,  Code Status: DO NOT RESUSCITATE  Family Communication: Discussed with daughter at bedside Disposition Plan: SNF in the next 24-48 hours   Consultants:   Orthopedic Surgery Dr. Linna Caprice   Procedures:  Displaced right intratrochanteric femur fracture status post operative intervention with IM nailing by orthopedic surgery currently being done   Antimicrobials:  Anti-infectives (From admission, onward)   Start     Dose/Rate Route Frequency Ordered Stop   12/17/18 1900  ceFAZolin (ANCEF) IVPB 2g/100 mL premix     2 g 200 mL/hr over 30 Minutes Intravenous Every 6 hours 12/17/18 1532 12/18/18 0348   12/17/18 1045  ceFAZolin (ANCEF) IVPB 2g/100 mL premix     2 g 200 mL/hr over 30 Minutes Intravenous On call to O.R. 12/17/18 1036 12/17/18 1246     Subjective: Seen and  examined at bedside she is more awake and interactive.  Husband is at bedside.  Patient did answer some my questions slightly but remains pleasantly demented.  Family has elected to SNF placement and she has a bed offer but currently requiring a COVID test to repeated which is pending.  No other concerns or complaints at this time.  Objective: Vitals:   12/19/18 0609 12/19/18 1339 12/19/18 2210 12/20/18 0614  BP: (!) 118/59 133/63 (!) 101/59 120/73  Pulse: 64 68 64 71  Resp: 16 16 16 16   Temp: 98.4 F (36.9 C)  97.7 F (36.5 C) 97.6 F (36.4 C)  TempSrc:   Axillary Oral  SpO2: 97% 98% 100% 95%  Weight:      Height:        Intake/Output Summary (Last 24 hours) at 12/20/2018 1246 Last data filed at 12/20/2018 0830 Gross per 24 hour  Intake 681.74 ml  Output 700 ml  Net -18.26 ml   Filed Weights   12/17/18 0207  Weight: 57.8 kg     Examination: Physical Exam:  Constitutional: Thin pleasantly demented elderly female who is awake and more alert today currently in NAD and appears calm and comfortable Eyes: Lids and conjunctivae normal, sclerae anicteric  ENMT: External Ears, Nose appear normal. Grossly normal hearing. Mucous membranes are moist.  Neck: Appears normal, supple, no cervical masses, normal ROM, no appreciable thyromegaly; no JVD Respiratory: Diminished to auscultation bilaterally, no wheezing, rales, rhonchi or crackles. Normal respiratory effort and patient is not tachypenic. No accessory muscle use.  Cardiovascular: RRR, no murmurs / rubs / gallops. S1 and S2 auscultated. No extremity edema.  Abdomen: Soft, non-tender, non-distended. Bowel sounds positive x4.  GU: Deferred. Musculoskeletal: No clubbing / cyanosis of digits/nails. No joint deformity upper and lower extremities.  Skin: No rashes, lesions, ulcers on limited skin evaluation and right hip dressing appears clean dry and intact. No induration; Warm and dry.  Neurologic: CN 2-12 grossly intact with no focal deficits. Romberg sign and cerebellar reflexes not assessed.  Psychiatric: Impaired judgment and insight. Alert and awake. Normal mood and appropriate affect.   Data Reviewed: I have personally reviewed following labs and imaging studies  CBC: Recent Labs  Lab 12/16/18 1930 12/17/18 0230 12/18/18 0306 12/19/18 0637 12/20/18 0247  WBC 9.9 12.2* 9.0 9.9 8.3  NEUTROABS 7.0  --   --   --  4.6  HGB 14.4 13.3 11.6* 11.6* 11.5*  HCT 43.0 40.9 35.0* 35.8* 35.7*  MCV 92.7 95.1 94.6 95.2 94.9  PLT 223 157 152 161 192   Basic Metabolic Panel: Recent Labs  Lab 12/16/18 1930 12/17/18 0230 12/18/18 0306 12/19/18 0637 12/20/18 0247  NA 140 138 138 141 144  K 4.2 3.8 3.6 4.1 4.6  CL 105 104 108 105 109  CO2 24 23 23 27 27   GLUCOSE 118* 145* 112* 101* 102*  BUN 12 11 11 10 9   CREATININE 0.73 0.61 0.68 0.54 0.66  CALCIUM 9.4 8.8* 8.0*  8.8* 9.1  MG  --   --   --   --  2.1  PHOS  --   --   --   --  3.1   GFR: Estimated Creatinine Clearance: 45.6 mL/min (by C-G formula based on SCr of 0.66 mg/dL). Liver Function Tests: Recent Labs  Lab 12/20/18 0247  AST 23  ALT 15  ALKPHOS 68  BILITOT 1.0  PROT 6.4*  ALBUMIN 3.2*   No results for input(s): LIPASE, AMYLASE in the last 168  hours. No results for input(s): AMMONIA in the last 168 hours. Coagulation Profile: Recent Labs  Lab 12/16/18 1930  INR 1.0   Cardiac Enzymes: No results for input(s): CKTOTAL, CKMB, CKMBINDEX, TROPONINI in the last 168 hours. BNP (last 3 results) No results for input(s): PROBNP in the last 8760 hours. HbA1C: No results for input(s): HGBA1C in the last 72 hours. CBG: No results for input(s): GLUCAP in the last 168 hours. Lipid Profile: No results for input(s): CHOL, HDL, LDLCALC, TRIG, CHOLHDL, LDLDIRECT in the last 72 hours. Thyroid Function Tests: No results for input(s): TSH, T4TOTAL, FREET4, T3FREE, THYROIDAB in the last 72 hours. Anemia Panel: Recent Labs    12/19/18 0637 12/19/18 0907  VITAMINB12  --  671  FOLATE  --  16.9  FERRITIN  --  154  TIBC  --  248*  IRON  --  32  RETICCTPCT 1.2  --    Sepsis Labs: No results for input(s): PROCALCITON, LATICACIDVEN in the last 168 hours.  Recent Results (from the past 240 hour(s))  SARS Coronavirus 2 [facility] order, Performed in [facility] hospital lab) Nasopharyngeal Nasopharyngeal Swab     Status: None   Collection Time: 12/16/18  8:37 PM   Specimen: Nasopharyngeal Swab  Result Value Ref Range Status   SARS Coronavirus 2 NEGATIVE NEGATIVE Final    Comment: (NOTE) If result is NEGATIVE SARS-CoV-2 target nucleic acids are NOT DETECTED. The SARS-CoV-2 RNA is generally detectable in upper and lower  respiratory specimens during the acute phase of infection. The lowest  concentration of SARS-CoV-2 viral copies this assay can detect is 250  copies / mL. A negative result  does not preclude SARS-CoV-2 infection  and should not be used as the sole basis for treatment or other  patient management decisions.  A negative result may occur with  improper specimen collection / handling, submission of specimen other  than nasopharyngeal swab, presence of viral mutation(s) within the  areas targeted by this assay, and inadequate number of viral copies  (<250 copies / mL). A negative result must be combined with clinical  observations, patient history, and epidemiological information. If result is POSITIVE SARS-CoV-2 target nucleic acids are DETECTED. The SARS-CoV-2 RNA is generally detectable in upper and lower  respiratory specimens dur ing the acute phase of infection.  Positive  results are indicative of active infection with SARS-CoV-2.  Clinical  correlation with patient history and other diagnostic information is  necessary to determine patient infection status.  Positive results do  not rule out bacterial infection or co-infection with other viruses. If result is PRESUMPTIVE POSTIVE SARS-CoV-2 nucleic acids MAY BE PRESENT.   A presumptive positive result was obtained on the submitted specimen  and confirmed on repeat testing.  While 2019 novel coronavirus  (SARS-CoV-2) nucleic acids may be present in the submitted sample  additional confirmatory testing may be necessary for epidemiological  and / or clinical management purposes  to differentiate between  SARS-CoV-2 and other Sarbecovirus currently known to infect humans.  If clinically indicated additional testing with an alternate test  methodology [phone]) is advised. The SARS-CoV-2 RNA is generally  detectable in upper and lower respiratory sp ecimens during the acute  phase of infection. The expected result is Negative. Fact Sheet for Patients:  StrictlyIdeas.no Fact Sheet for Healthcare Providers: BankingDealers.co.za This test is not yet approved or  cleared by the [place] FDA and has been authorized for detection and/or diagnosis of SARS-CoV-2 by FDA under an Emergency Use Authorization (  EUA).  This EUA will remain in effect (meaning this test can be used) for the duration of the COVID-19 declaration under Section 564(b)(1) of the Act, 21 U.S.C. section 360bbb-3(b)(1), unless the authorization is terminated or revoked sooner. Performed at [facility], [address] [place], [place] [address]   Surgical PCR screen     Status: None   Collection Time: 12/17/18 10:04 AM   Specimen: Nasal Mucosa; Nasal Swab  Result Value Ref Range Status   MRSA, PCR NEGATIVE NEGATIVE Final   Staphylococcus aureus NEGATIVE NEGATIVE Final    Comment: (NOTE) The Xpert SA Assay (FDA approved for NASAL specimens in patients 72 years of age and older), is one component of a comprehensive surveillance program. It is not intended to diagnose infection nor to guide or monitor treatment. Performed at [facility], [address] [place], [place] [address]     Radiology Studies: No results found. Scheduled Meds:  aspirin EC  81 mg Oral Daily   brimonidine  1 drop Both Eyes BID   docusate sodium  100 mg Oral BID   enoxaparin (LOVENOX) injection  40 mg Subcutaneous Q24H   latanoprost  1 drop Both Eyes QHS   levothyroxine  112 mcg Oral Q0600   memantine  28 mg Oral Daily   mirtazapine  15 mg Oral QHS   rivastigmine  13.3 mg Transdermal Daily   senna  1 tablet Oral BID   timolol  1 drop Both Eyes BID   vitamin B-12  2,500 mcg Oral Daily   Continuous Infusions:  sodium chloride Stopped (12/18/18 1410)    LOS: 4 days   Merlene Laughter, DO [facility] PAGER is on AMION  If 7PM-7AM, please contact night-coverage [email or web address] Password [email or web address] 12/20/2018, 12:46 PM \

## 2018-12-20 NOTE — Progress Notes (Signed)
PT Cancellation Note  Patient Details Name: Jocelyn Gilbert MRN: [id number] DOB: May 14, 1934   Cancelled Treatment:      PT attempted this am - spouse advises pt very lethargic following meds.  Attempted in pm but pt about to be placed in isolation for Covid testing.  Will follow.  [place] Pager [phone] Office [phone]   [facility] 12/20/2018, 2:59 PM

## 2018-12-20 NOTE — TOC Progression Note (Signed)
Transition of Care [facility]) - Progression Note    Patient Details  Name: Jocelyn Gilbert MRN: [id number] Date of Birth: [birth year]  [facility] of Care [facility] CM/SW Contact  [address] [place], [place] Phone Number: 12/20/2018, 12:58 PM  Clinical Narrative:    This social worker spoke to patient's daughter by phone today. She has chosen [facility] for rehab services for patient. She agrees for patient to be transferred by [facility]. This social worker contacted [facility], spoke with Juliann Pulse [phone] who stated that patient will need another COVID test done before she is admitted. Per Juliann Pulse, they will accept results from a rapid test. If negative result comes back today, she can transfer today. If not patient can go tomorrow.   [place], LCSW Clinical Social Work [phone]       Expected Discharge Plan and Services                                                 Social Determinants of Health (SDOH) Interventions    Readmission Risk Interventions No flowsheet data found.

## 2018-12-20 NOTE — Progress Notes (Signed)
Performed nasopharyngeal swab for COVID, patient placed on airborne and contact precautions prior. Will continue to monitor.

## 2018-12-21 LAB — SARS CORONAVIRUS 2 (TAT 6-24 HRS): SARS Coronavirus 2: NEGATIVE

## 2018-12-21 MED ORDER — ONDANSETRON HCL 4 MG PO TABS: 4.0000 mg | ORAL_TABLET | Freq: Four times a day (QID) | ORAL | 0 refills | Status: AC | PRN

## 2018-12-21 MED ORDER — ASPIRIN 81 MG PO TBEC: 81.0000 mg | DELAYED_RELEASE_TABLET | Freq: Every day | ORAL | 0 refills | Status: AC

## 2018-12-21 MED ORDER — SENNA 8.6 MG PO TABS: 1.0000 | ORAL_TABLET | Freq: Two times a day (BID) | ORAL | 0 refills | Status: AC

## 2018-12-21 MED ORDER — MENTHOL 3 MG MT LOZG: 1.0000 | LOZENGE | OROMUCOSAL | 12 refills | Status: AC | PRN

## 2018-12-21 NOTE — Progress Notes (Signed)
Called report to [place] at [facility]. Provided my direct number for any further questions. Patient being transferred by [facility].

## 2018-12-21 NOTE — TOC Transition Note (Signed)
Transition of Care [facility]) - CM/SW Discharge Note   Patient Details  Name: Jocelyn Gilbert MRN: [id number] Date of Birth: 07-06-1934  Transition of Care [facility]) CM/SW Contact:  [place], LCSW Phone Number: 12/21/2018, 11:32 AM   Clinical Narrative:   CSW in contact with [name] from Admission at [facility]: [phone]. CSW notified [name] of completed COVID testing that came back negative.   Pt will be transferred to [facility] by [facility]. Number to call for Report is [phone]. Ask for Nurse covering Rm # [phone].    Pts Spouse Quin Hoop notified of transfer. Time for scheduled transfer is 1pm.   Final next level of care: Skilled Nursing Facility Barriers to Discharge: SNF Pending discharge summary   Patient Goals and CMS Choice Patient states their goals for this hospitalization and ongoing recovery are:: Pts goal is to discharge to SNF for short term rehab CMS Medicare.gov Compare Post Acute Care list provided to:: Patient Choice offered to / list presented to : Patient  Discharge Placement PASRR number recieved: 12/19/18 Existing PASRR number confirmed : 12/19/18          Patient chooses bed at: [facility] Patient to be transferred to facility by: [place] Name of family member notified: Misti Towle LS:[phone] Patient and family notified of of transfer: 12/21/18  Discharge Plan and Services                                     Social Determinants of Health (SDOH) Interventions     Readmission Risk Interventions No flowsheet data found.

## 2018-12-21 NOTE — Discharge Summary (Signed)
Physician Discharge Summary  [facility] ZOX:[id number] DOB: [birth year] DOA: 12/16/2018  PCP: Lynnea FerrierKlein, Bert J III, MD  Admit date: 12/16/2018 Discharge date: 12/21/2018  Admitted From: Home Disposition: SNF  Recommendations for Outpatient Follow-up:  1. Follow up with PCP in 1-2 weeks 2. Follow up with Orthopedic Surgery Dr. Linna CapriceSwinteck within 1-2 weeks 3. Please obtain CMP/CBC, Mag, Phos in one week 4. Please follow up on the following pending results:  Home Health: No Equipment/Devices: None    Discharge Condition: Stable CODE STATUS: DO NOT RESUSCITATE Diet recommendation: Regular Diet   Brief/Interim Summary: HPI per Dr. Marcial Pacasimothy Opyd on 12/16/2018 Nechama GuardMildred P Southernis a 84 y.o.femalewith medical history significant forAlzheimer dementia, history of TIAs, and hypothyroidism, now presenting to the emergency department for evaluation of right hip pain and inability to bear weight after a fall at home.She had been in her usual state when she stood up and immediately fell onto her right side, witnessed by home health aide who reports no head strike or LOC. The patient was unable to bear weight since then. She is normally ambulatory at home but not verbal.  ED Course:Upon arrival to the ED, patient is found to be afebrile, saturating mid 90s on room air, and with stable blood pressure. EKG features a sinus rhythm with left axis deviation and chest x-ray is negative for acute cardiopulmonary disease. Plain radiographs of the hip demonstrate acute intertrochanteric fracture of the proximal right femur. Chemistry panel and CBC are unremarkable. Orthopedic surgery was consulted by the ED physician, plans tentatively for surgical repair on 12/17/2018, and recommends a medical admission.  **Interim History Patient was seen by Orthopedic Surgery and she went for operative Intervention (12/17/2018).  Postoperatively she is doing well but yesterday she was less responsive and more lethargic.   She was a little difficult to arouse but did push me away and did answer some questions briefly but fell back to sleep.  PT/OT was consulted and recommending SNF and Orthopedics are recommending weightbearing as tolerated.  She is improved and more is more awake today.  SNF was requesting a repeat COVID which was Negative.  She was stable for medical perspective to be discharged and will need to follow up with Orthopedic Surgery as an outpatient.   Discharge Diagnoses:  Principal Problem:   Closed fracture of right hip (HCC) Active Problems:   Alzheimer disease (HCC)   History of TIAs   Hypothyroidism  Acute Intrertrochanteric Fracture of the Right Proximal Femur s/p IM Fixation of Right Femur POD4 -Presented with right hip pain and inability to bear weight after a ground level mechanical fall at home; she is found to have acute right intertrochanteric femur fractureon X-Ray -Orthopedic surgery Dr. Victorino DikeHewitt is consulting and planning for OR on 12/17/18; Dr. Linna CapriceSwinteck took the patient for surgical intervention with an IM nailing on 12/17/2018 -Based on the available data, [name] an estimated2% risk of perioperative MI or cardiac arrest -We held ASA, kept NPO after midnight, continue pain-control, supportive care -IVF now Stopped -Further care per orthopedic surgery  -Currently has pain control with methocarbamol 500 mg p.o./IV every 6 as needed for muscle spasm along with IV Morphine 1 to 2 mg every 3 hours for moderate and severe pain  -Continue with antiemetics with tomograms IV ondansetron every 6 PRN for nausea vomiting -Received preoperative antibiotics with cefazolin -X-Ray post operatively showed "Interval intramedullary rod and distal screw fixation of right proximal femoral fracture with expected postsurgical changes." -Orthopedics evaluated today and recommending  PT OT to evaluate and treat and weightbearing as tolerated with a walker -Dr. Linna CapriceSwinteck recommends DVT  prophylaxis with SCDs teds as well as Lovenox at D/C  -Her aspirin was held on admission but [facility] and will need to verify that she was taking it but it appears she was taking it at the end of July so will resumed at 81 mg po Daily  -PT OT recommending skilled nursing facility and have reached out to the transition of care team for assistance with placement -Continue with stool softeners at facility for bowel regimen -Patient has a bed offer currently and needed a repeat COVID test prior to discharge. It was pending yesterday but resulted as Negative. She is stable to D/C to SNF and follow up with Orthopedic Surgery within 1-2 weeks   Dementia with likely Post-Operative Delirium and Lethargy  - She is A&O x1 at baseline but usually able to ambulate on her own per family -ContinueMemantine 28 mg p.o. daily, Mirtazepine 15 mg p.o. nightly, Rivistigmine 13.3 transdermal patch every 24 daily and as-needed Olanzapine 2.5 mg p.o. nightly as needed agitation -We will resume her Risperidone 0.5 mg p.o. daily as needed for anxiety eventually but will continue to hold for now she is a little more lethargic yesterday and not as responsive; Today she is more awake and alert but because of her cognitive issues from her dementia she is refusing to swallow her pills -Follow up Care at SNF  Hypothyroidism -Continue Levothyroxine 112 mcg po Daily, dose recently adjusted  History of TIA's -Currently Held ASA for Surgery but will resume at 81 mg po Daily now  -continueAtorvastatin 40 mils p.o. daily  Leukocytosis, improved -Mild. WBC went 9.9 -> 12.2 -> 9.0 -> 9.9 -> 8.3 -Likely Reactive in the setting of Pain but is now improved  -Continue to Monitor for S/Sx of Infection -Continue to Trend and Repeat CBC at SNF within 1 week   Hyperglycemia -Blood Sugar on Admission was 118 and repeat yesterday AM was 102 -Blood sugars ranging from 101-145 on Daily  BMP's -Last HbA1c on 11/05/2018 was 5.1 -Continue to Monitor for the Need of Insulin  HLD -C/w Atrovastatin 40 mg p.o. nightly  Glaucoma -C/w Brimonidine 0.2 % Ophthalmic Solution 1 drop both eyes, Latanoprost 1 drop Both Eyes qHS. And Timolol 1 drop both eyes BID  Vitamin B12 Deficiency -Continue with 2500 mcg p.o. daily  Post-Operative Anemia/Normocytic Anemia -Expected Post-Operatiev Drop as Patient's Hgb/Hct went from 14.4/43.0 -> 13.3/40.9 -> 11.6/35.0 -> 11.6/35.8 -> 11.5/35.7 -Checked Anemia Panel and showed an iron level of 32, U IBC of 216, TIBC of 248, Saturation Ratios of 13%, ferritin 154, folate of 16.9, vitamin B12 of 671 -Continue to Monitor for S/Sx of Bleeding; Currently no overt bleeding noted -[facility]  Discharge Instructions Discharge Instructions    Call MD for:  difficulty breathing, headache or visual disturbances   Complete by: As directed    Call MD for:  extreme fatigue   Complete by: As directed    Call MD for:  hives   Complete by: As directed    Call MD for:  persistant dizziness or light-headedness   Complete by: As directed    Call MD for:  persistant nausea and vomiting   Complete by: As directed    Call MD for:  redness, tenderness, or signs of infection (pain, swelling, redness, odor or green/yellow discharge around incision site)  Complete by: As directed    Call MD for:  severe uncontrolled pain   Complete by: As directed    Call MD for:  temperature >100.4   Complete by: As directed    Diet - low sodium heart healthy   Complete by: As directed    Discharge instructions   Complete by: As directed    You were cared for by a hospitalist during your hospital stay. If you have any questions about your discharge medications or the care you received while you were in the hospital after you are discharged, you can call the unit and ask to speak with the hospitalist on call if the hospitalist that took care of you is not  available. Once you are discharged, your primary care physician will handle any further medical issues. Please note that NO REFILLS for any discharge medications will be authorized once you are discharged, as it is imperative that you return to your primary care physician (or establish a relationship with a primary care physician if you do not have one) for your aftercare needs so that they can reassess your need for medications and monitor your lab values.  Follow up with PCP and Orthopedic Surgery. Take all medications as prescribed. If symptoms change or worsen please return to the ED for evaluation   Increase activity slowly   Complete by: As directed      Allergies as of 12/21/2018   No Known Allergies     Medication List    TAKE these medications   acetaminophen 325 MG tablet Commonly known as: TYLENOL Take 325 mg by mouth every 6 (six) hours as needed for headache.   aspirin 81 MG EC tablet Take 1 tablet (81 mg total) by mouth daily. Start taking on: December 22, 2018   atorvastatin 40 MG tablet Commonly known as: LIPITOR Take 40 mg by mouth daily at 6 PM.   azelastine 0.1 % nasal spray Commonly known as: ASTELIN Place 1 spray into both nostrils 2 (two) times daily as needed for rhinitis or allergies.   B-12 2500 MCG Tabs Take 2,500 mcg by mouth daily.   Combigan 0.2-0.5 % ophthalmic solution Generic drug: brimonidine-timolol Place 1 drop into both eyes 2 (two) times daily.   enoxaparin 40 MG/0.4ML injection Commonly known as: LOVENOX Inject 0.4 mLs (40 mg total) into the skin daily.   HYDROcodone-acetaminophen 5-325 MG tablet Commonly known as: NORCO/VICODIN Take 1-2 tablets by mouth every 4 (four) hours as needed for moderate pain (pain score 4-6).   latanoprost 0.005 % ophthalmic solution Commonly known as: XALATAN Place 1 drop into both eyes at bedtime.   levothyroxine 112 MCG tablet Commonly known as: SYNTHROID Take 112 mcg by mouth every morning.    menthol-cetylpyridinium 3 MG lozenge Commonly known as: CEPACOL Take 1 lozenge (3 mg total) by mouth as needed for sore throat (sore throat).   mirtazapine 15 MG tablet Commonly known as: REMERON Take 15 mg by mouth at bedtime.   Namenda XR 28 MG Cp24 24 hr capsule Generic drug: memantine Take 28 mg by mouth daily.   OLANZapine 2.5 MG tablet Commonly known as: ZYPREXA Take 1 tablet (2.5 mg total) by mouth at bedtime as needed (agitation).   ondansetron 4 MG tablet Commonly known as: ZOFRAN Take 1 tablet (4 mg total) by mouth every 6 (six) hours as needed for nausea.   risperiDONE 0.5 MG tablet Commonly known as: RISPERDAL Take 0.5 mg by mouth daily as needed for anxiety.  rivastigmine 13.3 MG/24HR Commonly known as: EXELON Place 13.3 mg onto the skin daily.   senna 8.6 MG Tabs tablet Commonly known as: SENOKOT Take 1 tablet (8.6 mg total) by mouth 2 (two) times daily.   Vitamin C Powd Take 500 mg by mouth daily.   Vitamin D (Ergocalciferol) 50 MCG (2000 UT) Caps Take 2 capsules by mouth daily.   zinc gluconate 50 MG tablet Take 50 mg by mouth daily.      Follow-up Information    Swinteck, Arlys John, MD. Schedule an appointment as soon as possible for a visit in 2 weeks.   Specialty: Orthopedic Surgery Why: For wound re-check Contact information: [address] [place] [address] [phone]          No Known Allergies  Consultations:  Orthopedic Surgery  Procedures/Studies: Dg Chest 1 View  Result Date: 12/16/2018 CLINICAL DATA:  Fall with hip fracture EXAM: CHEST  1 VIEW COMPARISON:  11/05/2018 FINDINGS: No focal opacity or pleural effusion. Stable cardiomediastinal silhouette with aortic atherosclerosis. No pneumothorax. IMPRESSION: No active disease. Electronically Signed   By: Jasmine Pang M.D.   On: 12/16/2018 19:32   Pelvis Portable  Result Date: 12/17/2018 CLINICAL DATA:  Post op right hip EXAM: PORTABLE PELVIS 1-2 VIEWS  COMPARISON:  02/28/2015, 12/16/2018 FINDINGS: Intramedullary rod and distal screw fixation of the right femur for proximal femoral fracture. Gas in the soft tissues consistent with recent surgery IMPRESSION: Interval intramedullary rod and distal screw fixation of right proximal femoral fracture with expected postsurgical changes. Electronically Signed   By: Jasmine Pang M.D.   On: 12/17/2018 15:04   Dg Knee Right Port  Result Date: 12/17/2018 CLINICAL DATA:  Known intertrochanteric right femur fracture EXAM: PORTABLE RIGHT KNEE - 1-2 VIEW COMPARISON:  Right hip radiographs from 1 day prior FINDINGS: No right knee fracture or dislocation. No suspicious focal osseous lesions. Minimal lateral compartment osteoarthritis. No radiopaque foreign body. Unable to assess for joint effusion given lack of true lateral view. IMPRESSION: No acute osseous abnormality in the right knee. Minimal right knee lateral compartment osteoarthritis. Electronically Signed   By: Delbert Phenix M.D.   On: 12/17/2018 08:38   Dg C-arm 1-60 Min-no Report  Result Date: 12/17/2018 Fluoroscopy was utilized by the requesting physician.  No radiographic interpretation.   Dg Hip Operative Unilat With Pelvis Right  Result Date: 12/17/2018 CLINICAL DATA:  ORIF of right hip fracture EXAM: OPERATIVE RIGHT HIP WITH PELVIS COMPARISON:  None. FLUOROSCOPY TIME:  Radiation Exposure Index (as provided by the fluoroscopic device): Not available If the device does not provide the exposure index: Fluoroscopy Time:  1 minutes 25 seconds Number of Acquired Images:  4 FINDINGS: Initial images again demonstrate intratrochanteric fracture without significant displacement. Proximal medullary rod with fixation screw is subsequently seen. The fracture fragments are in near anatomic alignment. IMPRESSION: ORIF of proximal right femoral fracture. Electronically Signed   By: Alcide Clever M.D.   On: 12/17/2018 14:14   Dg Hip Unilat  With Pelvis 2-3 Views  Right  Result Date: 12/16/2018 CLINICAL DATA:  84 year old female fall in kitchen. EXAM: DG HIP (WITH OR WITHOUT PELVIS) 2-3V RIGHT COMPARISON:  03/01/2015 FINDINGS: Marked scoliosis deformity involving the lumbar spine, convex to the left. There is an acute, intertrochanteric fracture involving the proximal right femur. Medial angulation of the distal fracture fragments noted. IMPRESSION: 1. Acute intertrochanteric fracture of the proximal right femur. Electronically Signed   By: Signa Kell M.D.   On: 12/16/2018 19:31  Subjective: Seen and examined at bedside she is doing fairly well today.  Still very pleasantly demented and has no concerns or complaints and no family currently at bedside.  Patient not agitated.  Repeat cover test was negative.  She is stable for discharge to a skilled nursing facility.  Discharge Exam: Vitals:   12/20/18 2140 12/21/18 0604  BP: (!) 141/66 122/83  Pulse: 66 70  Resp: 16 16  Temp: 98.3 F (36.8 C) 98.3 F (36.8 C)  SpO2: 90% 99%   Vitals:   12/19/18 2210 12/20/18 0614 12/20/18 2140 12/21/18 0604  BP: (!) 101/59 120/73 (!) 141/66 122/83  Pulse: 64 71 66 70  Resp: 16 16 16 16   Temp: 97.7 F (36.5 C) 97.6 F (36.4 C) 98.3 F (36.8 C) 98.3 F (36.8 C)  TempSrc: Axillary Oral Oral Axillary  SpO2: 100% 95% 90% 99%  Weight:      Height:       General: Pt is alert, awake, not in acute distress Cardiovascular: RRR, S1/S2 +, no rubs, no gallops Respiratory: Diminished bilaterally, no wheezing, no rhonchi Abdominal: Soft, NT, ND, bowel sounds + Extremities: no edema, no cyanosis; right leg extremity incisions appeared clean, dry, and intact  The results of significant diagnostics from this hospitalization (including imaging, microbiology, ancillary and laboratory) are listed below for reference.    Microbiology: Recent Results (from the past 240 hour(s))  SARS Coronavirus 2 [facility] order, Performed in [facility] hospital lab) Nasopharyngeal  Nasopharyngeal Swab     Status: None   Collection Time: 12/16/18  8:37 PM   Specimen: Nasopharyngeal Swab  Result Value Ref Range Status   SARS Coronavirus 2 NEGATIVE NEGATIVE Final    Comment: (NOTE) If result is NEGATIVE SARS-CoV-2 target nucleic acids are NOT DETECTED. The SARS-CoV-2 RNA is generally detectable in upper and lower  respiratory specimens during the acute phase of infection. The lowest  concentration of SARS-CoV-2 viral copies this assay can detect is 250  copies / mL. A negative result does not preclude SARS-CoV-2 infection  and should not be used as the sole basis for treatment or other  patient management decisions.  A negative result may occur with  improper specimen collection / handling, submission of specimen other  than nasopharyngeal swab, presence of viral mutation(s) within the  areas targeted by this assay, and inadequate number of viral copies  (<250 copies / mL). A negative result must be combined with clinical  observations, patient history, and epidemiological information. If result is POSITIVE SARS-CoV-2 target nucleic acids are DETECTED. The SARS-CoV-2 RNA is generally detectable in upper and lower  respiratory specimens dur ing the acute phase of infection.  Positive  results are indicative of active infection with SARS-CoV-2.  Clinical  correlation with patient history and other diagnostic information is  necessary to determine patient infection status.  Positive results do  not rule out bacterial infection or co-infection with other viruses. If result is PRESUMPTIVE POSTIVE SARS-CoV-2 nucleic acids MAY BE PRESENT.   A presumptive positive result was obtained on the submitted specimen  and confirmed on repeat testing.  While 2019 novel coronavirus  (SARS-CoV-2) nucleic acids may be present in the submitted sample  additional confirmatory testing may be necessary for epidemiological  and / or clinical management purposes  to differentiate between   SARS-CoV-2 and other Sarbecovirus currently known to infect humans.  If clinically indicated additional testing with an alternate test  methodology [phone]) is advised. The SARS-CoV-2 RNA is generally  detectable  in upper and lower respiratory sp ecimens during the acute  phase of infection. The expected result is Negative. Fact Sheet for Patients:  BoilerBrush.com.cy Fact Sheet for Healthcare Providers: https://pope.com/ This test is not yet approved or cleared by the [place] FDA and has been authorized for detection and/or diagnosis of SARS-CoV-2 by FDA under an Emergency Use Authorization (EUA).  This EUA will remain in effect (meaning this test can be used) for the duration of the COVID-19 declaration under Section 564(b)(1) of the Act, 21 U.S.C. section 360bbb-3(b)(1), unless the authorization is terminated or revoked sooner. Performed at [facility], [address], [place], [place] [address]   Surgical PCR screen     Status: None   Collection Time: 12/17/18 10:04 AM   Specimen: Nasal Mucosa; Nasal Swab  Result Value Ref Range Status   MRSA, PCR NEGATIVE NEGATIVE Final   Staphylococcus aureus NEGATIVE NEGATIVE Final    Comment: (NOTE) The Xpert SA Assay (FDA approved for NASAL specimens in patients 40 years of age and older), is one component of a comprehensive surveillance program. It is not intended to diagnose infection nor to guide or monitor treatment. Performed at [facility], [address], [place], [place] [address]   SARS CORONAVIRUS 2 (TAT 6-24 HRS) Nasopharyngeal Nasopharyngeal Swab     Status: None   Collection Time: 12/20/18 11:47 AM   Specimen: Nasopharyngeal Swab  Result Value Ref Range Status   SARS Coronavirus 2 NEGATIVE NEGATIVE Final    Comment: (NOTE) SARS-CoV-2 target nucleic acids are NOT DETECTED. The SARS-CoV-2 RNA is generally detectable in upper and  lower respiratory specimens during the acute phase of infection. Negative results do not preclude SARS-CoV-2 infection, do not rule out co-infections with other pathogens, and should not be used as the sole basis for treatment or other patient management decisions. Negative results must be combined with clinical observations, patient history, and epidemiological information. The expected result is Negative. Fact Sheet for Patients: HairSlick.no Fact Sheet for Healthcare Providers: quierodirigir.com This test is not yet approved or cleared by the [place] FDA and  has been authorized for detection and/or diagnosis of SARS-CoV-2 by FDA under an Emergency Use Authorization (EUA). This EUA will remain  in effect (meaning this test can be used) for the duration of the COVID-19 declaration under Section 56 4(b)(1) of the Act, 21 U.S.C. section 360bbb-3(b)(1), unless the authorization is terminated or revoked sooner. Performed at [facility], [address] [place], [place] [address]     Labs: BNP (last 3 results) No results for input(s): BNP in the last 8760 hours. Basic Metabolic Panel: Recent Labs  Lab 12/16/18 1930 12/17/18 0230 12/18/18 0306 12/19/18 0637 12/20/18 0247  NA 140 138 138 141 144  K 4.2 3.8 3.6 4.1 4.6  CL 105 104 108 105 109  CO2 24 23 23 27 27   GLUCOSE 118* 145* 112* 101* 102*  BUN 12 11 11 10 9   CREATININE 0.73 0.61 0.68 0.54 0.66  CALCIUM 9.4 8.8* 8.0* 8.8* 9.1  MG  --   --   --   --  2.1  PHOS  --   --   --   --  3.1   Liver Function Tests: Recent Labs  Lab 12/20/18 0247  AST 23  ALT 15  ALKPHOS 68  BILITOT 1.0  PROT 6.4*  ALBUMIN 3.2*   No results for input(s): LIPASE, AMYLASE in the last 168 hours. No results for input(s):  AMMONIA in the last 168 hours. CBC: Recent Labs  Lab 12/16/18 1930 12/17/18 0230 12/18/18 0306 12/19/18 0637 12/20/18 0247  WBC 9.9 12.2* 9.0 9.9  8.3  NEUTROABS 7.0  --   --   --  4.6  HGB 14.4 13.3 11.6* 11.6* 11.5*  HCT 43.0 40.9 35.0* 35.8* 35.7*  MCV 92.7 95.1 94.6 95.2 94.9  PLT 223 157 152 161 192   Cardiac Enzymes: No results for input(s): CKTOTAL, CKMB, CKMBINDEX, TROPONINI in the last 168 hours. BNP: Invalid input(s): POCBNP CBG: No results for input(s): GLUCAP in the last 168 hours. D-Dimer No results for input(s): DDIMER in the last 72 hours. Hgb A1c No results for input(s): HGBA1C in the last 72 hours. Lipid Profile No results for input(s): CHOL, HDL, LDLCALC, TRIG, CHOLHDL, LDLDIRECT in the last 72 hours. Thyroid function studies No results for input(s): TSH, T4TOTAL, T3FREE, THYROIDAB in the last 72 hours.  Invalid input(s): FREET3 Anemia work up Recent Labs    12/19/18 0637 12/19/18 0907  VITAMINB12  --  671  FOLATE  --  16.9  FERRITIN  --  154  TIBC  --  248*  IRON  --  32  RETICCTPCT 1.2  --    Urinalysis    Component Value Date/Time   COLORURINE YELLOW (A) 11/05/2018 0505   APPEARANCEUR HAZY (A) 11/05/2018 0505   LABSPEC 1.011 11/05/2018 0505   PHURINE 5.0 11/05/2018 0505   GLUCOSEU NEGATIVE 11/05/2018 0505   HGBUR LARGE (A) 11/05/2018 0505   BILIRUBINUR NEGATIVE 11/05/2018 0505   KETONESUR NEGATIVE 11/05/2018 0505   PROTEINUR NEGATIVE 11/05/2018 0505   NITRITE POSITIVE (A) 11/05/2018 0505   LEUKOCYTESUR MODERATE (A) 11/05/2018 0505   Sepsis Labs Invalid input(s): PROCALCITONIN,  WBC,  LACTICIDVEN Microbiology Recent Results (from the past 240 hour(s))  SARS Coronavirus 2 [facility] order, Performed in [facility] hospital lab) Nasopharyngeal Nasopharyngeal Swab     Status: None   Collection Time: 12/16/18  8:37 PM   Specimen: Nasopharyngeal Swab  Result Value Ref Range Status   SARS Coronavirus 2 NEGATIVE NEGATIVE Final    Comment: (NOTE) If result is NEGATIVE SARS-CoV-2 target nucleic acids are NOT DETECTED. The SARS-CoV-2 RNA is generally detectable in upper and lower   respiratory specimens during the acute phase of infection. The lowest  concentration of SARS-CoV-2 viral copies this assay can detect is 250  copies / mL. A negative result does not preclude SARS-CoV-2 infection  and should not be used as the sole basis for treatment or other  patient management decisions.  A negative result may occur with  improper specimen collection / handling, submission of specimen other  than nasopharyngeal swab, presence of viral mutation(s) within the  areas targeted by this assay, and inadequate number of viral copies  (<250 copies / mL). A negative result must be combined with clinical  observations, patient history, and epidemiological information. If result is POSITIVE SARS-CoV-2 target nucleic acids are DETECTED. The SARS-CoV-2 RNA is generally detectable in upper and lower  respiratory specimens dur ing the acute phase of infection.  Positive  results are indicative of active infection with SARS-CoV-2.  Clinical  correlation with patient history and other diagnostic information is  necessary to determine patient infection status.  Positive results do  not rule out bacterial infection or co-infection with other viruses. If result is PRESUMPTIVE POSTIVE SARS-CoV-2 nucleic acids MAY BE PRESENT.   A presumptive positive result was obtained on the submitted specimen  and confirmed on repeat testing.  While 2019 novel coronavirus  (SARS-CoV-2) nucleic acids may be present in the submitted sample  additional confirmatory testing may be necessary for epidemiological  and / or clinical management purposes  to differentiate between  SARS-CoV-2 and other Sarbecovirus currently known to infect humans.  If clinically indicated additional testing with an alternate test  methodology [phone]) is advised. The SARS-CoV-2 RNA is generally  detectable in upper and lower respiratory sp ecimens during the acute  phase of infection. The expected result is Negative. Fact  Sheet for Patients:  StrictlyIdeas.no Fact Sheet for Healthcare Providers: BankingDealers.co.za This test is not yet approved or cleared by the [place] FDA and has been authorized for detection and/or diagnosis of SARS-CoV-2 by FDA under an Emergency Use Authorization (EUA).  This EUA will remain in effect (meaning this test can be used) for the duration of the COVID-19 declaration under Section 564(b)(1) of the Act, 21 U.S.C. section 360bbb-3(b)(1), unless the authorization is terminated or revoked sooner. Performed at [facility], [place] [address] [place], [place] [address]   Surgical PCR screen     Status: None   Collection Time: 12/17/18 10:04 AM   Specimen: Nasal Mucosa; Nasal Swab  Result Value Ref Range Status   MRSA, PCR NEGATIVE NEGATIVE Final   Staphylococcus aureus NEGATIVE NEGATIVE Final    Comment: (NOTE) The Xpert SA Assay (FDA approved for NASAL specimens in patients 51 years of age and older), is one component of a comprehensive surveillance program. It is not intended to diagnose infection nor to guide or monitor treatment. Performed at [facility], [place] [address] [place], [place] [address]   SARS CORONAVIRUS 2 (TAT 6-24 HRS) Nasopharyngeal Nasopharyngeal Swab     Status: None   Collection Time: 12/20/18 11:47 AM   Specimen: Nasopharyngeal Swab  Result Value Ref Range Status   SARS Coronavirus 2 NEGATIVE NEGATIVE Final    Comment: (NOTE) SARS-CoV-2 target nucleic acids are NOT DETECTED. The SARS-CoV-2 RNA is generally detectable in upper and lower respiratory specimens during the acute phase of infection. Negative results do not preclude SARS-CoV-2 infection, do not rule out co-infections with other pathogens, and should not be used as the sole basis for treatment or other patient management decisions. Negative results must be combined with clinical  observations, patient history, and epidemiological information. The expected result is Negative. Fact Sheet for Patients: SugarRoll.be Fact Sheet for Healthcare Providers: https://www.woods-mathews.com/ This test is not yet approved or cleared by the [place] FDA and  has been authorized for detection and/or diagnosis of SARS-CoV-2 by FDA under an Emergency Use Authorization (EUA). This EUA will remain  in effect (meaning this test can be used) for the duration of the COVID-19 declaration under Section 56 4(b)(1) of the Act, 21 U.S.C. section 360bbb-3(b)(1), unless the authorization is terminated or revoked sooner. Performed at [facility], [place] [address] [place], [place] [address]    Time coordinating discharge: 35 minutes  SIGNED:  Kerney Elbe, DO [facility] 12/21/2018, 11:44 AM Pager is on [address]  If 7PM-7AM, please contact night-coverage [email or web address] Password [email or web address]

## 2019-01-21 ENCOUNTER — Telehealth: Payer: Self-pay | Admitting: Adult Health Nurse Practitioner

## 2019-01-21 NOTE — Telephone Encounter (Signed)
Called to schedule follow up appointment.  Left VM with reason for call and contact info Jettie Lazare K. Olena Heckle NP

## 2019-01-21 NOTE — Telephone Encounter (Signed)
Patient's husband returned my call.  States that his wife is in rehab at [facility] right now but is due to be discharged this coming Thursday.  States he has appealed for more time in rehab and has not received an answer from that appeal yet.  He will call when his wife is discharged to set up an appointment.  Does express that he wants an appointment when she gets home as soon as possible. Luddie Boghosian K. Olena Heckle NP

## 2019-02-17 ENCOUNTER — Other Ambulatory Visit: Payer: Medicare Other | Admitting: Adult Health Nurse Practitioner

## 2019-02-17 ENCOUNTER — Other Ambulatory Visit: Payer: Self-pay

## 2019-02-17 ENCOUNTER — Telehealth: Payer: Self-pay | Admitting: Adult Health Nurse Practitioner

## 2019-02-17 DIAGNOSIS — Z515 Encounter for palliative care: Secondary | ICD-10-CM

## 2019-02-17 DIAGNOSIS — F0281 Dementia in other diseases classified elsewhere with behavioral disturbance: Secondary | ICD-10-CM

## 2019-02-17 NOTE — Telephone Encounter (Signed)
Spoke with patient's husband [name] about scheduling a Palliative f/u visit and he was in agreement with this.  I have scheduled a Telephone f/u visit for 02/17/19 @ 4:30 PM.

## 2019-02-18 NOTE — Progress Notes (Signed)
[facility] Consult Note Telephone: [phone]  Fax: [phone]  PATIENT NAME: Jocelyn Gilbert DOB: Nov 17, 1934 MRN: [id number]  PRIMARY CARE PROVIDER:   Adin Hector, MD  REFERRING PROVIDER:  Adin Hector, MD [place],  [place] [address]  RESPONSIBLE PARTY:   [place], [place] [phone]  Due to the COVID-19 crisis, this visit was done via telemedicine and it was initiated and consent by this patient and or family. Video-audio (telehealth) contact was unable to be done due to technical barriers from the patient's side. The husband and both daughters were on the phone.    RECOMMENDATIONS and PLAN:  1.  Advanced care planning.  Patient is a DNR.  Family wants patient to be cared for at home.  She recently had a fall with closed fracture of the right hip and did undergo surgery for IM nailing and spent some time in rehab at a SNF. They have discussed with [name] and are in agreement that they do not want to send her back to the hospital.  Palliative will be the first whom family will contact with any concerns and/or declines.  Palliative will make recommendations for any decline and symptom management.    2.  Dementia. FAST 7.  Patient has mixed Alzheimer's and vascular dementia.  Patient has aphasia.  She is able to ambulate with walker and stand by assist.  Unable to do ADLs.  Does have caregivers that come into the home. Did have an episode over the weekend in which she became almost comatose.  Family reports that she was at baseline on Saturday and eating and drinking fine.  On Sunday she became immobile, wouldn't walk, non responsive, wouldn't eat, drink, or take meds.  Stated that her jaw dropped and when she breathed they heard a rattling noise.  Stated that on Monday afternoon around 4pm that she became responsive again and was almost back to baseline. She started eating and  drinking again but her movement was slower. She does have a history of TIAs.  Family did state that since she was back from rehab she had not been taking her olanzapine and that last Thursday or Friday she had started to be given olanzapine at night with her remeron as she was showing more agitation.  This episode could possibly have been increased sedation related to medication.   Family reports that she does at times gets agitated especially around 3-3:30 in the afternoon.  Had questions about determining if sundowning or agitation from interaction with others.  Discussed that how a dementia patient is approached can cause agitation and also that sometimes it is the sundowning.  Discussed ways to handle when agitated, such as distraction, leaving patient safely alone for a few minutes.  Also discussed using her PRN risperidone to help with the agitation. Would not use olanzapine as suspect this may have caused over sedation.  Will contact Dr. Olin Pia office to let them know of episode. Have appointment next week in person as I feel an in person visit is warranted to further assess the patient and for any decline.    I spent 60 minutes providing this consultation,  from 4:30 to 5:30. More than 50% of the time in this consultation was spent coordinating communication.   HISTORY OF PRESENT ILLNESS:  Nhu Glasby Soter is a 84 y.o. year old female with multiple medical problems including Alzheimer's and vascular dementia, hypothyroidism, hyperlipidemia,  hx of TIA, Vitamin B12 deficiency, osteopenia, Vitamin D insufficiency, depression. Palliative Care was asked to help address goals of care.   CODE STATUS: DNR  PPS: 40% HOSPICE ELIGIBILITY/DIAGNOSIS: TBD  PHYSICAL EXAM:   Deferred  PAST MEDICAL HISTORY:  Past Medical History:  Diagnosis Date  . Alzheimer disease (HCC)   . History of TIAs   . Thyroid disease     SOCIAL HX:  Social History   Tobacco Use  . Smoking status: Former Smoker     Types: Cigarettes  . Smokeless tobacco: Never Used  Substance Use Topics  . Alcohol use: Yes    Comment: occas    ALLERGIES: No Known Allergies   PERTINENT MEDICATIONS:  Outpatient Encounter Medications as of 02/17/2019  Medication Sig  . acetaminophen (TYLENOL) 325 MG tablet Take 325 mg by mouth every 6 (six) hours as needed for headache.  . Ascorbic Acid (VITAMIN C) POWD Take 500 mg by mouth daily.  Marland Kitchen aspirin EC 81 MG EC tablet Take 1 tablet (81 mg total) by mouth daily.  Marland Kitchen atorvastatin (LIPITOR) 40 MG tablet Take 40 mg by mouth daily at 6 PM.  . azelastine (ASTELIN) 0.1 % nasal spray Place 1 spray into both nostrils 2 (two) times daily as needed for rhinitis or allergies.   . COMBIGAN 0.2-0.5 % ophthalmic solution Place 1 drop into both eyes 2 (two) times daily.   . Cyanocobalamin (B-12) 2500 MCG TABS Take 2,500 mcg by mouth daily.  Marland Kitchen HYDROcodone-acetaminophen (NORCO/VICODIN) 5-325 MG tablet Take 1-2 tablets by mouth every 4 (four) hours as needed for moderate pain (pain score 4-6).  Marland Kitchen latanoprost (XALATAN) 0.005 % ophthalmic solution Place 1 drop into both eyes at bedtime.  Marland Kitchen levothyroxine (SYNTHROID) 112 MCG tablet Take 112 mcg by mouth every morning.  . menthol-cetylpyridinium (CEPACOL) 3 MG lozenge Take 1 lozenge (3 mg total) by mouth as needed for sore throat (sore throat).  . mirtazapine (REMERON) 15 MG tablet Take 15 mg by mouth at bedtime.  Marland Kitchen NAMENDA XR 28 MG CP24 24 hr capsule Take 28 mg by mouth daily.  Marland Kitchen OLANZapine (ZYPREXA) 2.5 MG tablet Take 1 tablet (2.5 mg total) by mouth at bedtime as needed (agitation).  . ondansetron (ZOFRAN) 4 MG tablet Take 1 tablet (4 mg total) by mouth every 6 (six) hours as needed for nausea.  . risperiDONE (RISPERDAL) 0.5 MG tablet Take 0.5 mg by mouth daily as needed for anxiety.  . rivastigmine (EXELON) 13.3 MG/24HR Place 13.3 mg onto the skin daily.  Marland Kitchen senna (SENOKOT) 8.6 MG TABS tablet Take 1 tablet (8.6 mg total) by mouth 2 (two) times  daily.  . Vitamin D, Ergocalciferol, 50 MCG (2000 UT) CAPS Take 2 capsules by mouth daily.   Marland Kitchen zinc gluconate 50 MG tablet Take 50 mg by mouth daily.   No facility-administered encounter medications on file as of 02/17/2019.        Marlena Clipper, NP

## 2019-02-23 ENCOUNTER — Telehealth: Payer: Self-pay

## 2019-02-23 NOTE — Telephone Encounter (Signed)
Spoke with [name] NP who recommended trying something topical over the counter to ease pain in hips. If pain eases, patient may eat something and be able to take pain medication. VM left and encouraged husband to call with further questions or concerns.

## 2019-02-23 NOTE — Telephone Encounter (Signed)
Received message to call patient' husband back regarding patient's pain to hips.Spoke to patient's husband who reported  That patient has had expression of pain to both hips. Expressions of pain has eased some but will still say yes when husband touches each hip. Patient will not take any medication this morning. No evidence of pain when patient went to bed last night, no recent falls. Plan is to speak with [name] NP and return call to patient's husband.

## 2019-02-27 ENCOUNTER — Telehealth: Payer: Self-pay | Admitting: Adult Health Nurse Practitioner

## 2019-02-27 NOTE — Telephone Encounter (Signed)
Called husband to check about patient's pain.  Left message and encouraged to call with questions.  Reminded of appointment on Monday 11/23 at 3pm.   Mirriam Vadala K. Olena Heckle NP

## 2019-03-02 ENCOUNTER — Other Ambulatory Visit: Payer: Self-pay

## 2019-03-02 ENCOUNTER — Other Ambulatory Visit: Payer: Medicare Other | Admitting: Adult Health Nurse Practitioner

## 2019-03-02 DIAGNOSIS — Z515 Encounter for palliative care: Secondary | ICD-10-CM

## 2019-03-02 DIAGNOSIS — F0281 Dementia in other diseases classified elsewhere with behavioral disturbance: Secondary | ICD-10-CM

## 2019-03-03 NOTE — Progress Notes (Signed)
Therapist, nutritional Palliative Care Consult Note Telephone: [phone]  Fax: [phone]  PATIENT NAME: Jocelyn Gilbert DOB: 10-Jun-1934 MRN: [id number]  PRIMARY CARE PROVIDER:   Lynnea Ferrier, MD  REFERRING PROVIDER:  Lynnea Ferrier, MD [address] [facility] [place]  [place] [address]  RESPONSIBLE PARTY:   Jocelyn Gilbert, Jocelyn Gilbert [phone]    RECOMMENDATIONS and PLAN:  1.  Advanced care planning.  Patient is a DNR.  Spent most of the visit answering questions about advanced care planning.  Discussing palliative vs hospice, disease progression, and process of who to contact in different situations.  Attempted to answer all of families questions.  Filled out new DNR form for patient as hers was lost in transfer when going from hospital to rehab.  Uploaded DNR to [facility] and left original with patient/husband.  Have appointment in 2 weeks to go over further.  2. Dementia. FAST 7.  Patient has mixed Alzheimer's and vascular dementia.  Patient has aphasia.  She is able to ambulate with walker and stand by assist.  Unable to do ADLs.  Does have caregivers that come into the home. Patient's appetite has been fair.  Able to feed herself finger foods and eats better when being fed by her husband.  Weight in August was 119.  Taken today is 119.  She is sitting more and upon arrival today she is lying in bed.  Caregiver states that she gets a reddened spot above her coccyx that will go away when she is not lying on it.  Advised on ways to prevent skin breakdown, such as keeping clean and dry, barrier cream, and repositioning.  Husband mentioned getting a thick pad to cover the hospital bed she is sleeping in and this too will help.  Continue supportive care and monitoring for any weight loss and offering supplemental drinks when needed.  Patient showing some decline with mobility and has started eating and drinking less.  Palliative care will continue to  monitor for decline and symptom management and make recommendations as needed.  I spent 120 minutes providing this consultation,  from 3:00 to 5:00. More than 50% of the time in this consultation was spent coordinating communication.   HISTORY OF PRESENT ILLNESS:  Jocelyn Gilbert is a 84 y.o. year old female with multiple medical problems including Alzheimer's and vascular dementia, hypothyroidism, hyperlipidemia, hx of TIA, Vitamin B12 deficiency, osteopenia, Vitamin D insufficiency, depression. Palliative Care was asked to help address goals of care.   CODE STATUS: DNR  PPS: 40% HOSPICE ELIGIBILITY/DIAGNOSIS: TBD  PHYSICAL EXAM:  BP 118/58  HR 88  O2 91% on RA  Weight 119 General: NAD, frail appearing, thin Cardiovascular: regular rate and rhythm Pulmonary: clear ant fields Abdomen: soft, nontender, + bowel sounds GU: no suprapubic tenderness Extremities: no edema, no joint deformities Skin: no rashes Neurological: Weakness; A&O to person and place. aphasia   PAST MEDICAL HISTORY:  Past Medical History:  Diagnosis Date  . Alzheimer disease (HCC)   . History of TIAs   . Thyroid disease     SOCIAL HX:  Social History   Tobacco Use  . Smoking status: Former Smoker    Types: Cigarettes  . Smokeless tobacco: Never Used  Substance Use Topics  . Alcohol use: Yes    Comment: occas    ALLERGIES: No Known Allergies   PERTINENT MEDICATIONS:  Outpatient Encounter Medications as of 03/02/2019  Medication Sig  . acetaminophen (TYLENOL) 325  MG tablet Take 325 mg by mouth every 6 (six) hours as needed for headache.  . Ascorbic Acid (VITAMIN C) POWD Take 500 mg by mouth daily.  Marland Kitchen aspirin EC 81 MG EC tablet Take 1 tablet (81 mg total) by mouth daily.  Marland Kitchen atorvastatin (LIPITOR) 40 MG tablet Take 40 mg by mouth daily at 6 PM.  . azelastine (ASTELIN) 0.1 % nasal spray Place 1 spray into both nostrils 2 (two) times daily as needed for rhinitis or allergies.   . COMBIGAN 0.2-0.5 %  ophthalmic solution Place 1 drop into both eyes 2 (two) times daily.   . Cyanocobalamin (B-12) 2500 MCG TABS Take 2,500 mcg by mouth daily.  Marland Kitchen HYDROcodone-acetaminophen (NORCO/VICODIN) 5-325 MG tablet Take 1-2 tablets by mouth every 4 (four) hours as needed for moderate pain (pain score 4-6).  Marland Kitchen latanoprost (XALATAN) 0.005 % ophthalmic solution Place 1 drop into both eyes at bedtime.  Marland Kitchen levothyroxine (SYNTHROID) 112 MCG tablet Take 112 mcg by mouth every morning.  . menthol-cetylpyridinium (CEPACOL) 3 MG lozenge Take 1 lozenge (3 mg total) by mouth as needed for sore throat (sore throat).  . mirtazapine (REMERON) 15 MG tablet Take 15 mg by mouth at bedtime.  Marland Kitchen NAMENDA XR 28 MG CP24 24 hr capsule Take 28 mg by mouth daily.  Marland Kitchen OLANZapine (ZYPREXA) 2.5 MG tablet Take 1 tablet (2.5 mg total) by mouth at bedtime as needed (agitation).  . ondansetron (ZOFRAN) 4 MG tablet Take 1 tablet (4 mg total) by mouth every 6 (six) hours as needed for nausea.  . risperiDONE (RISPERDAL) 0.5 MG tablet Take 0.5 mg by mouth daily as needed for anxiety.  . rivastigmine (EXELON) 13.3 MG/24HR Place 13.3 mg onto the skin daily.  Marland Kitchen senna (SENOKOT) 8.6 MG TABS tablet Take 1 tablet (8.6 mg total) by mouth 2 (two) times daily.  . Vitamin D, Ergocalciferol, 50 MCG (2000 UT) CAPS Take 2 capsules by mouth daily.   Marland Kitchen zinc gluconate 50 MG tablet Take 50 mg by mouth daily.   No facility-administered encounter medications on file as of 03/02/2019.      Axton Cihlar Jenetta Downer, NP

## 2019-03-17 ENCOUNTER — Telehealth: Payer: Self-pay

## 2019-03-17 NOTE — Telephone Encounter (Signed)
At the request of [name] NP, phone call placed to patient's husband to change time of scheduled visit until Friday at 1pm. VM left

## 2019-03-18 ENCOUNTER — Telehealth: Payer: Self-pay | Admitting: Adult Health Nurse Practitioner

## 2019-03-18 NOTE — Telephone Encounter (Signed)
Left VM with husband and daughter, [name], to see if tomorrow's appointment could be rescheduled for Friday afternoon. Kristinia Leavy K. Olena Heckle NP

## 2019-03-18 NOTE — Telephone Encounter (Signed)
Patient's husband left me a VM about changing our appointment time.  Stated that Friday at 1:30 was a good time.  Tried calling back to confirm receipt of his message and was unable to leave a message due to VM box being full.  Will go ahead and change appointment to 03/20/2019 at 1:30 pm Acen Craun K. Olena Heckle NP

## 2019-03-19 ENCOUNTER — Telehealth: Payer: Self-pay | Admitting: Adult Health Nurse Practitioner

## 2019-03-19 ENCOUNTER — Other Ambulatory Visit: Payer: Medicare Other

## 2019-03-19 NOTE — Telephone Encounter (Signed)
Daughter called to confirm change of appointment time and date.  Will see patient tomorrow at 1:30pm Jocelyn Gilbert K. Olena Heckle NP

## 2019-03-20 ENCOUNTER — Other Ambulatory Visit: Payer: Self-pay

## 2019-03-20 ENCOUNTER — Other Ambulatory Visit: Payer: Medicare Other | Admitting: Adult Health Nurse Practitioner

## 2019-03-20 ENCOUNTER — Other Ambulatory Visit: Payer: Medicare Other

## 2019-03-20 DIAGNOSIS — F0281 Dementia in other diseases classified elsewhere with behavioral disturbance: Secondary | ICD-10-CM

## 2019-03-20 DIAGNOSIS — G309 Alzheimer's disease, unspecified: Secondary | ICD-10-CM

## 2019-03-20 DIAGNOSIS — Z515 Encounter for palliative care: Secondary | ICD-10-CM

## 2019-03-20 NOTE — Progress Notes (Signed)
COMMUNITY PALLIATIVE CARE SW NOTE  PATIENT NAME: Jocelyn Gilbert DOB: 1934/08/04 MRN: [id number]  PRIMARY CARE PROVIDER: Adin Hector, MD  RESPONSIBLE PARTY:  Acct ID - Guarantor Home Phone Work Phone Relationship Acct Type  [id number] - [id number] [phone] Self P/F     [name], [name], [place] [address]     PLAN OF CARE and INTERVENTIONS:             1. GOALS OF CARE/ ADVANCE CARE PLANNING:  Patient is DNR, form is in the home. NP completed MOST form with family. Goal is for patient to be cared for and at home with husband. 2. SOCIAL/EMOTIONAL/SPIRITUAL ASSESSMENT/ INTERVENTIONS:  SW and [name], NP met with patient, Jocelyn Gilbert (patient's husband) and Jocelyn Gilbert (patient's daughter) in the home. Jocelyn Gilbert, patient's private caregiver was also present in the home during visit. Patient appeared comfortable. Patient's appetite is good. Patient is sleeping "okay", but does wake up at times to go to the bathroom or experiencing hallucinations. Team discussed disease process and progression, provided education. Discussed decline and impact on patient's daily care needs. 3. PATIENT/CAREGIVER EDUCATION/ COPING:  Patient was alert, calm and sitting on couch during visit. Patient would smile and nod when spoken to. Patient is having a "good day" per family and caregiver. Patient does have periods of agitation, but family feels that it is manageable. Jocelyn Gilbert verbalized that he educates caregivers on how to respond and approach patient so that she can be comfortable and remain calm. Family is supportive and expresses feelings openly with team. Jocelyn Gilbert was tearful at one point in discussion of patient's care and caregiver fatigue. Jocelyn Gilbert does talk with neighbors, friends and family about his situation with patient. Jocelyn Gilbert also has an online caregiver support group that he connects with. Team and family had lengthy conversation about care in the home, options for care, patient/family safety and goals of care. Family  is appreciative of palliative care support.  4. PERSONAL EMERGENCY PLAN:  Family/caregivers will call 9-1-1 for emergencies. 5. COMMUNITY RESOURCES COORDINATION/ HEALTH CARE NAVIGATION:  Family coordinates care. Jocelyn Gilbert communicates with PCP regularly. No concerns.  6. FINANCIAL/LEGAL CONCERNS/INTERVENTIONS:  None.     SOCIAL HX:  Social History   Tobacco Use  . Smoking status: Former Smoker    Types: Cigarettes  . Smokeless tobacco: Never Used  Substance Use Topics  . Alcohol use: Yes    Comment: occas    CODE STATUS:   Code Status: Prior (DNR) ADVANCED DIRECTIVES: N MOST FORM COMPLETE:  Yes. HOSPICE EDUCATION PROVIDED: Yes.  PPS: Patient is dependent of most ADLs. Ambulating with walker and standby assist.   I spent 75 minutes with patient/family, from 1:30-2:45p providing education, support and consultation.   Jocelyn Lombard, LCSW

## 2019-03-21 NOTE — Progress Notes (Signed)
[facility] Consult Note Telephone: [phone]  Fax: [phone]  PATIENT NAME: Jocelyn Gilbert DOB: 1934/06/17 MRN: [id number]  PRIMARY CARE PROVIDER:   Adin Hector, MD  REFERRING PROVIDER:  Adin Hector, MD [place],  [place] [address]  RESPONSIBLE PARTY:   Husband, [name] [phone]       RECOMMENDATIONS and PLAN:  1.  Advanced care planning.  Patient is DNR.  Lengthy discussion on MOST form.  Attempted to answer family's questions.  MOST form filled out indicating DNR, comfort measures, no antibiotics, IV fluids as indicated, feeding tube for a trial period.  Did explain that the feeding tube is a surgical procedure and with patient's frailty she may not be a candidate for a feeding tube.  Husband expressed understanding.    2.  Dementia.  Dementia. FAST 7. Patient has mixed Alzheimer's and vascular dementia. Patient has aphasia. She is able to ambulate with walker and stand by assist. Unable to do ADLs. Does have caregivers that come into the home. Patient's appetite has been fair.  Able to feed herself finger foods and eats better when being fed by her husband.  No changes in appetite since last visit.  Husband does state that she has been sitting or staying in bed more.  States that she has more energy for activities when she takes more frequent rest breaks.  Continue supportive care and monitoring for any weight loss and offering supplemental drinks when needed.  3.  Caregiver support.  SW accompanied provider on today's visit.  Husband does have caregivers that come during the day.  Discussed in the future getting help for bedtime routine as husband is going through some medical issues.  He does state that he thinks they are doing well now but will consider it in the future, especially as her mobility declines and she requires more assistance.  He does have caregiver support  on facebook.  Will continue to assess for caregiver burden.  Patient showing slow decline related to dementia.  Palliative care will continue to monitor for decline and symptom management and make recommendations as needed.  I spent 150 minutes providing this consultation,  from 1:30 to 4:00. More than 50% of the time in this consultation was spent coordinating communication.   HISTORY OF PRESENT ILLNESS:  Jocelyn Gilbert is a 84 y.o. year old female with multiple medical problems including Alzheimer's and vascular dementia, hypothyroidism, hyperlipidemia, hx of TIA, Vitamin B12 deficiency, osteopenia, Vitamin D insufficiency, depression. Palliative Care was asked to help address goals of care.   CODE STATUS: DNR  PPS: 40%  PHYSICAL EXAM:  BP 118/72 General: NAD, frail appearing, thin Cardiovascular: regular rate and rhythm Pulmonary: lung sounds clear; normal respiratory rate Abdomen: soft, nontender, + bowel sounds GU: no suprapubic tenderness Extremities: no edema, no joint deformities Skin: no rashes Neurological: Weakness; A&O to person and place. Aphasia  HOSPICE ELIGIBILITY/DIAGNOSIS: TBD  PAST MEDICAL HISTORY:  Past Medical History:  Diagnosis Date  . Alzheimer disease ([facility])   . History of TIAs   . Thyroid disease     SOCIAL HX:  Social History   Tobacco Use  . Smoking status: Former Smoker    Types: Cigarettes  . Smokeless tobacco: Never Used  Substance Use Topics  . Alcohol use: Yes    Comment: occas    ALLERGIES: No Known Allergies   PERTINENT MEDICATIONS:  Outpatient Encounter Medications as of  03/20/2019  Medication Sig  . acetaminophen (TYLENOL) 325 MG tablet Take 325 mg by mouth every 6 (six) hours as needed for headache.  . Ascorbic Acid (VITAMIN C) POWD Take 500 mg by mouth daily.  Marland Kitchen aspirin EC 81 MG EC tablet Take 1 tablet (81 mg total) by mouth daily.  Marland Kitchen atorvastatin (LIPITOR) 40 MG tablet Take 40 mg by mouth daily at 6 PM.  . azelastine  (ASTELIN) 0.1 % nasal spray Place 1 spray into both nostrils 2 (two) times daily as needed for rhinitis or allergies.   . COMBIGAN 0.2-0.5 % ophthalmic solution Place 1 drop into both eyes 2 (two) times daily.   . Cyanocobalamin (B-12) 2500 MCG TABS Take 2,500 mcg by mouth daily.  Marland Kitchen HYDROcodone-acetaminophen (NORCO/VICODIN) 5-325 MG tablet Take 1-2 tablets by mouth every 4 (four) hours as needed for moderate pain (pain score 4-6).  Marland Kitchen latanoprost (XALATAN) 0.005 % ophthalmic solution Place 1 drop into both eyes at bedtime.  Marland Kitchen levothyroxine (SYNTHROID) 112 MCG tablet Take 112 mcg by mouth every morning.  . menthol-cetylpyridinium (CEPACOL) 3 MG lozenge Take 1 lozenge (3 mg total) by mouth as needed for sore throat (sore throat).  . mirtazapine (REMERON) 15 MG tablet Take 15 mg by mouth at bedtime.  Marland Kitchen NAMENDA XR 28 MG CP24 24 hr capsule Take 28 mg by mouth daily.  Marland Kitchen OLANZapine (ZYPREXA) 2.5 MG tablet Take 1 tablet (2.5 mg total) by mouth at bedtime as needed (agitation).  . ondansetron (ZOFRAN) 4 MG tablet Take 1 tablet (4 mg total) by mouth every 6 (six) hours as needed for nausea.  . risperiDONE (RISPERDAL) 0.5 MG tablet Take 0.5 mg by mouth daily as needed for anxiety.  . rivastigmine (EXELON) 13.3 MG/24HR Place 13.3 mg onto the skin daily.  Marland Kitchen senna (SENOKOT) 8.6 MG TABS tablet Take 1 tablet (8.6 mg total) by mouth 2 (two) times daily.  . Vitamin D, Ergocalciferol, 50 MCG (2000 UT) CAPS Take 2 capsules by mouth daily.   Marland Kitchen zinc gluconate 50 MG tablet Take 50 mg by mouth daily.   No facility-administered encounter medications on file as of 03/20/2019.      Harol Shabazz Marlena Clipper, NP

## 2019-05-01 ENCOUNTER — Telehealth: Payer: Self-pay | Admitting: Adult Health Nurse Practitioner

## 2019-05-01 NOTE — Telephone Encounter (Signed)
Returned daughter's call to set up appointment for follow up.  Left VM with contact info Nysir Fergusson K. Garner Nash NP

## 2019-05-04 ENCOUNTER — Telehealth: Payer: Self-pay | Admitting: Adult Health Nurse Practitioner

## 2019-05-04 NOTE — Telephone Encounter (Signed)
Spoke with daughter about her mother's increased weakness.  Have set up appointment for 05/14/2019 at 11am  Arley Garant K. Garner Nash NP

## 2019-05-14 ENCOUNTER — Telehealth: Payer: Self-pay

## 2019-05-14 ENCOUNTER — Other Ambulatory Visit: Payer: Medicare Other | Admitting: Adult Health Nurse Practitioner

## 2019-05-14 ENCOUNTER — Other Ambulatory Visit: Payer: Self-pay

## 2019-05-14 DIAGNOSIS — Z515 Encounter for palliative care: Secondary | ICD-10-CM

## 2019-05-14 DIAGNOSIS — F0281 Dementia in other diseases classified elsewhere with behavioral disturbance: Secondary | ICD-10-CM

## 2019-05-14 NOTE — Telephone Encounter (Signed)
At the direction of [name] NP, phone call placed to PCP office to provide update on NP visit today including recommendations for PT eval for Home Safety assessment. Patient has displayed increased weakness, may need equipment to assist with ADLS and Caregiver education. Also reported that patient has been having hallucinations at bedtime that are frightening to her. Provided NP recommendations for low dose of Seroquel or Zyprexa at bed time or as needed.

## 2019-05-14 NOTE — Progress Notes (Signed)
[facility] Consult Note Telephone: [phone]  Fax: [phone]  PATIENT NAME: Jocelyn Gilbert DOB: [birth year] MRN: [id number]  PRIMARY CARE PROVIDER:   Adin Hector, MD  REFERRING PROVIDER:  Adin Hector, MD [place],  [place] [address]  RESPONSIBLE PARTY:   Husband, Jocelyn Gilbert [phone]       RECOMMENDATIONS and PLAN:  1.  Advanced care planning.  Patient is DNR.  2.  Dementia.FAST 7.  Patient has mixed Alzheimer's and vascular dementia. Patient has aphasia.  She is able to ambulate with walker and stand by assist. Unable to do ADLs. Does have caregivers that come into the home.  Patient is having more times where she is staying in bed and at times resisting care.  Caregiver able to get her into the shower maybe once a week.  Other times has to give her a bath sitting or in the bed.  Husband does state that at night she is having hallucinations in which she is staring at the ceiling with her arms stretched to the ceiling.  She expresses to him that she is talking to angels.  The hallucinations can be calming to her but at times do cause her to be frightened. One night husband did find her at the foot of the bed trying to get out of bed during a hallucination. These hallucinations have been going on for about 2 months but are becoming more frequent.  Her appetite is fair.  She will have good days in which she eats well and other days in which she sleeps more and does not eat much.  Have explained disease progression and that what the family is seeing is a progression of the dementia.  Husband does have concerns with getting her up off the floor if she falls.  She has not had a recent fall but he states that she will slide between the chair and ottoman at times.  Husband is inquiring about something that could help him lift her up if she were on the floor.  Discussed types of lifts and  having a safety assessment.  RN clinical navigator has reached out to Dr. Olin Pia office for home health safety assessment and starting seroquel or zyprexa for the hallucinations.    3.  Caregiver support.  Patient has caregivers that come into the home during day 7a-3p M-F.  Daughter is seeing more fatigue in her dad, who is the primary caregiver, as the patient's hallucinations and she has a bed alarm if she tries to get up unassisted gets him up around 7 times a night.  States that he does go back to sleep right away but does feel tired during the day.  He does have concerns having someone else come into the home for extra help.  Concerns of them being able to establish a good relationship with his wife and if they will do all that would be expected to help in her care.  These are valid concerns and expressed that he needed to provide self-care for himself to better provide for his wife.  He is providing excellent care for his wife but with his own medical issues could use help getting her in bed in the evenings to decrease the physical strain on his body.  Daughter helps in the evenings as well to get patient into bed but she cannot be there every night. Left list of agencies for in home care supplied  by SW.    Patient is having a slow decline related to her dementia.  Palliative care will continue to monitor for symptom management/decline and make recommendations as needed.  Next appointment in 2 weeks.  I spent 120 minutes providing this consultation,  including time with patient/family, chart review, provider coordination, and documentation. More than 50% of the time in this consultation was spent coordinating communication.   HISTORY OF PRESENT ILLNESS:  Jocelyn Gilbert is a 85 y.o. year old female with multiple medical problems including Alzheimer's and vascular dementia, hypothyroidism, hyperlipidemia, hx of TIA, Vitamin B12 deficiency, osteopenia, Vitamin D insufficiency, depression. Palliative  Care was asked to help address goals of care.   CODE STATUS: DNR  PPS 40% HOSPICE ELIGIBILITY/DIAGNOSIS: TBD  PHYSICAL EXAM:  BP  118/68  HR 71  Weight 119 General: NAD, frail appearing, thin Cardiovascular: regular rate and rhythm Pulmonary: lung sounds clear; normal respiratory effort Abdomen: soft, nontender, + bowel sounds GU: no suprapubic tenderness Extremities: no edema, no joint deformities Skin: no rashes on exposed skin Neurological: Weakness; A&O to person and place   PAST MEDICAL HISTORY:  Past Medical History:  Diagnosis Date  . Alzheimer disease ([facility])   . History of TIAs   . Thyroid disease     SOCIAL HX:  Social History   Tobacco Use  . Smoking status: Former Smoker    Types: Cigarettes  . Smokeless tobacco: Never Used  Substance Use Topics  . Alcohol use: Yes    Comment: occas    ALLERGIES: No Known Allergies   PERTINENT MEDICATIONS:  Outpatient Encounter Medications as of 05/14/2019  Medication Sig  . acetaminophen (TYLENOL) 325 MG tablet Take 325 mg by mouth every 6 (six) hours as needed for headache.  . Ascorbic Acid (VITAMIN C) POWD Take 500 mg by mouth daily.  Marland Kitchen aspirin EC 81 MG EC tablet Take 1 tablet (81 mg total) by mouth daily.  Marland Kitchen atorvastatin (LIPITOR) 40 MG tablet Take 40 mg by mouth daily at 6 PM.  . azelastine (ASTELIN) 0.1 % nasal spray Place 1 spray into both nostrils 2 (two) times daily as needed for rhinitis or allergies.   . COMBIGAN 0.2-0.5 % ophthalmic solution Place 1 drop into both eyes 2 (two) times daily.   . Cyanocobalamin (B-12) 2500 MCG TABS Take 2,500 mcg by mouth daily.  Marland Kitchen enoxaparin (LOVENOX) 40 MG/0.4ML injection Inject 0.4 mLs (40 mg total) into the skin daily.  Marland Kitchen HYDROcodone-acetaminophen (NORCO/VICODIN) 5-325 MG tablet Take 1-2 tablets by mouth every 4 (four) hours as needed for moderate pain (pain score 4-6).  Marland Kitchen latanoprost (XALATAN) 0.005 % ophthalmic solution Place 1 drop into both eyes at bedtime.  Marland Kitchen levothyroxine  (SYNTHROID) 112 MCG tablet Take 112 mcg by mouth every morning.  . menthol-cetylpyridinium (CEPACOL) 3 MG lozenge Take 1 lozenge (3 mg total) by mouth as needed for sore throat (sore throat).  . mirtazapine (REMERON) 15 MG tablet Take 15 mg by mouth at bedtime.  Marland Kitchen NAMENDA XR 28 MG CP24 24 hr capsule Take 28 mg by mouth daily.  Marland Kitchen OLANZapine (ZYPREXA) 2.5 MG tablet Take 1 tablet (2.5 mg total) by mouth at bedtime as needed (agitation).  . ondansetron (ZOFRAN) 4 MG tablet Take 1 tablet (4 mg total) by mouth every 6 (six) hours as needed for nausea.  . risperiDONE (RISPERDAL) 0.5 MG tablet Take 0.5 mg by mouth daily as needed for anxiety.  . rivastigmine (EXELON) 13.3 MG/24HR Place 13.3 mg onto the skin daily.  Marland Kitchen  senna (SENOKOT) 8.6 MG TABS tablet Take 1 tablet (8.6 mg total) by mouth 2 (two) times daily.  . Vitamin D, Ergocalciferol, 50 MCG (2000 UT) CAPS Take 2 capsules by mouth daily.   Marland Kitchen zinc gluconate 50 MG tablet Take 50 mg by mouth daily.   No facility-administered encounter medications on file as of 05/14/2019.     Laithan Conchas Marlena Clipper, NP

## 2019-05-26 ENCOUNTER — Other Ambulatory Visit: Payer: Self-pay

## 2019-05-26 ENCOUNTER — Other Ambulatory Visit: Payer: Medicare Other | Admitting: Adult Health Nurse Practitioner

## 2019-05-26 DIAGNOSIS — Z515 Encounter for palliative care: Secondary | ICD-10-CM

## 2019-05-26 DIAGNOSIS — F0281 Dementia in other diseases classified elsewhere with behavioral disturbance: Secondary | ICD-10-CM

## 2019-05-27 NOTE — Progress Notes (Signed)
[facility] Consult Note Telephone: [phone]  Fax: [phone]  PATIENT NAME: Jocelyn Gilbert DOB: [birth year] MRN: [id number]  PRIMARY CARE PROVIDER:   Lynnea Ferrier, MD  REFERRING PROVIDER:  Lynnea Ferrier, MD [address] [place] [address]  RESPONSIBLE PARTY:   Husband, Channing Mutters [phone]      RECOMMENDATIONS and PLAN:  1.  Advanced care planning. Patient is DNR.  2.  Dementia.FAST 7.  Patient has mixed Alzheimer's and vascular dementia. Patient has aphasia.  She is able to ambulate with walker and stand by assist. Unable to do ADLs. Does have caregivers that come into the home. Patient is having more weakness.  Family states that at times her legs give out on her while she is standing with her walker.  Denies falls but have had to catch her before falling a couple times.  Family and caregiver have stated that she is becoming more agitated and still having hallucinations.  Patient does have risperidone 0.5mg  BID PRN for agitation and have started giving it to her one tab in the morning and one tab at bedtime.  Family not sure if this is helping right now as she has just started taking it.  Will continue to monitor for effectiveness.  Husband has discussed in home safety assessment with Dr. Graciela Husbands and have agreed that they will wait to do this when she is in more need of further equipment, such as lift, for her safety.    3.  Caregiver support.  Husband and caregivers are present for patient's safety at this time but husband does acknowledge that as she becomes weaker he may need more help in the future.  Ongoing discussion of disease progression and added support for family when needed.  Will continue to give support and help with resources when husband is ready.  Patient is having a slow decline related to her dementia.  Palliative care will continue to monitor for symptom  management/decline and make recommendations as needed.  Next appointment in 4 weeks.  I spent 60 minutes providing this consultation,  including time with patient/family, chart review, provider coordination, and documentation. More than 50% of the time in this consultation was spent coordinating communication.   HISTORY OF PRESENT ILLNESS:  Jocelyn Gilbert is a 85 y.o. year old female with multiple medical problems including including Alzheimer's and vascular dementia, hypothyroidism, hyperlipidemia, hx of TIA, Vitamin B12 deficiency, osteopenia, Vitamin D insufficiency, depression. Palliative Care was asked to help address goals of care.   CODE STATUS: DNR  PPS: 40% HOSPICE ELIGIBILITY/DIAGNOSIS: TBD  PHYSICAL EXAM:   General: NAD, frail appearing, thin Extremities: no edema, no joint deformities Neurological: Weakness; A&O to person and place   PAST MEDICAL HISTORY:  Past Medical History:  Diagnosis Date  . Alzheimer disease ([facility])   . History of TIAs   . Thyroid disease     SOCIAL HX:  Social History   Tobacco Use  . Smoking status: Former Smoker    Types: Cigarettes  . Smokeless tobacco: Never Used  Substance Use Topics  . Alcohol use: Yes    Comment: occas    ALLERGIES: No Known Allergies   PERTINENT MEDICATIONS:  Outpatient Encounter Medications as of 05/26/2019  Medication Sig  . acetaminophen (TYLENOL) 325 MG tablet Take 325 mg by mouth every 6 (six) hours as needed for headache.  . Ascorbic Acid (VITAMIN C) POWD Take 500 mg by mouth  daily.  . aspirin EC 81 MG EC tablet Take 1 tablet (81 mg total) by mouth daily.  Marland Kitchen atorvastatin (LIPITOR) 40 MG tablet Take 40 mg by mouth daily at 6 PM.  . azelastine (ASTELIN) 0.1 % nasal spray Place 1 spray into both nostrils 2 (two) times daily as needed for rhinitis or allergies.   . COMBIGAN 0.2-0.5 % ophthalmic solution Place 1 drop into both eyes 2 (two) times daily.   . Cyanocobalamin (B-12) 2500 MCG TABS Take 2,500 mcg  by mouth daily.  Marland Kitchen HYDROcodone-acetaminophen (NORCO/VICODIN) 5-325 MG tablet Take 1-2 tablets by mouth every 4 (four) hours as needed for moderate pain (pain score 4-6).  Marland Kitchen latanoprost (XALATAN) 0.005 % ophthalmic solution Place 1 drop into both eyes at bedtime.  Marland Kitchen levothyroxine (SYNTHROID) 112 MCG tablet Take 112 mcg by mouth every morning.  . menthol-cetylpyridinium (CEPACOL) 3 MG lozenge Take 1 lozenge (3 mg total) by mouth as needed for sore throat (sore throat).  . mirtazapine (REMERON) 15 MG tablet Take 15 mg by mouth at bedtime.  Marland Kitchen NAMENDA XR 28 MG CP24 24 hr capsule Take 28 mg by mouth daily.  Marland Kitchen OLANZapine (ZYPREXA) 2.5 MG tablet Take 1 tablet (2.5 mg total) by mouth at bedtime as needed (agitation).  . ondansetron (ZOFRAN) 4 MG tablet Take 1 tablet (4 mg total) by mouth every 6 (six) hours as needed for nausea.  . risperiDONE (RISPERDAL) 0.5 MG tablet Take 0.5 mg by mouth daily as needed for anxiety.  . rivastigmine (EXELON) 13.3 MG/24HR Place 13.3 mg onto the skin daily.  Marland Kitchen senna (SENOKOT) 8.6 MG TABS tablet Take 1 tablet (8.6 mg total) by mouth 2 (two) times daily.  . Vitamin D, Ergocalciferol, 50 MCG (2000 UT) CAPS Take 2 capsules by mouth daily.   Marland Kitchen zinc gluconate 50 MG tablet Take 50 mg by mouth daily.   No facility-administered encounter medications on file as of 05/26/2019.     Jensyn Shave Jenetta Downer, NP

## 2019-06-09 ENCOUNTER — Other Ambulatory Visit: Payer: Self-pay

## 2019-06-09 ENCOUNTER — Other Ambulatory Visit: Payer: Medicare Other | Admitting: Adult Health Nurse Practitioner

## 2019-06-09 ENCOUNTER — Telehealth: Payer: Self-pay | Admitting: Adult Health Nurse Practitioner

## 2019-06-09 ENCOUNTER — Telehealth: Payer: Self-pay

## 2019-06-09 DIAGNOSIS — F0281 Dementia in other diseases classified elsewhere with behavioral disturbance: Secondary | ICD-10-CM

## 2019-06-09 DIAGNOSIS — Z515 Encounter for palliative care: Secondary | ICD-10-CM

## 2019-06-09 DIAGNOSIS — G309 Alzheimer's disease, unspecified: Secondary | ICD-10-CM

## 2019-06-09 NOTE — Telephone Encounter (Signed)
Phone call placed to Dr.Klein's office to request an order for hospice eval and to confirm that he will remain the attending.

## 2019-06-09 NOTE — Telephone Encounter (Signed)
Tried calling husband to return his message.  Left VM on home phone with contact info to call back.  Tried calling cell phone number and line was busy.   Did reach out to daughter and patient is starting to develop pressure injuries to her ankle and bottom.  The one on her ankle is open and have encouraged using padding on bedrails to prevent further injury.  One on her bottom is not open and have suggested using foam pad for prevention or using a barrier cream with zinc oxide. Have appointment in 2 weeks to evaluate further.  Gale Klar K. Garner Nash NP

## 2019-06-09 NOTE — Progress Notes (Signed)
[facility] Consult Note Telephone: [phone]  Fax: [phone]  PATIENT NAME: Jocelyn Gilbert DOB: January 12, 1935 MRN: [id number]  PRIMARY CARE PROVIDER:   Adin Hector, MD  REFERRING PROVIDER:  Adin Hector, MD [place],  [place] [address]  RESPONSIBLE PARTY:     Rhona Raider [phone]  Clearence Ped, daughter [phone]  RECOMMENDATIONS and PLAN:  1.  Advanced care planning.  Patient is a DNR  2.  Dementia.  Spoke with husband today who states that patient is sleeping more and it is taking 2 of them to get her to stand up to use bedside commode.  He states that he only able to get her to eat bites when he does try to feed her.  Patient is nonverbal but does try to speak and at times you can make out what she is saying but mostly can't make out what she is saying.  She is requiring almost total assistance with ADLs and can no longer take her to the bathtub to get cleaned up.  They give her bed baths. She has developed a stage I pressure injury to left buttock and to ankle from bed rail.  Discussed hospice with husband and he wants hospice services if she is eligible. Have reached out to hospice doctors who feel that she is hospice eligible.  Have reached out to Dr. Olin Pia office for hospice referral.  Husband states that he wants his daughter [name] to be contacted by hospice.  I spent 60 minutes providing this consultation,  from 11:00 to 12:00. More than 50% of the time in this consultation was spent coordinating communication.   HISTORY OF PRESENT ILLNESS:  Jocelyn Gilbert is a 85 y.o. year old female with multiple medical problems including including Alzheimer's and vascular dementia, hypothyroidism, hyperlipidemia, hx of TIA, Vitamin B12 deficiency, osteopenia, Vitamin D insufficiency, depression. Palliative Care was asked to help address goals of care.   CODE  STATUS: DNR  PPS: 30% HOSPICE ELIGIBILITY/DIAGNOSIS: yes/mixed dementia  PHYSICAL EXAM:   Deferred  PAST MEDICAL HISTORY:  Past Medical History:  Diagnosis Date  . Alzheimer disease ([place])   . History of TIAs   . Thyroid disease     SOCIAL HX:  Social History   Tobacco Use  . Smoking status: Former Smoker    Types: Cigarettes  . Smokeless tobacco: Never Used  Substance Use Topics  . Alcohol use: Yes    Comment: occas    ALLERGIES: No Known Allergies   PERTINENT MEDICATIONS:  Outpatient Encounter Medications as of 06/09/2019  Medication Sig  . acetaminophen (TYLENOL) 325 MG tablet Take 325 mg by mouth every 6 (six) hours as needed for headache.  . Ascorbic Acid (VITAMIN C) POWD Take 500 mg by mouth daily.  Marland Kitchen aspirin EC 81 MG EC tablet Take 1 tablet (81 mg total) by mouth daily.  Marland Kitchen atorvastatin (LIPITOR) 40 MG tablet Take 40 mg by mouth daily at 6 PM.  . azelastine (ASTELIN) 0.1 % nasal spray Place 1 spray into both nostrils 2 (two) times daily as needed for rhinitis or allergies.   . COMBIGAN 0.2-0.5 % ophthalmic solution Place 1 drop into both eyes 2 (two) times daily.   . Cyanocobalamin (B-12) 2500 MCG TABS Take 2,500 mcg by mouth daily.  Marland Kitchen enoxaparin (LOVENOX) 40 MG/0.4ML injection Inject 0.4 mLs (40 mg total) into the skin daily.  Marland Kitchen HYDROcodone-acetaminophen (NORCO/VICODIN) 5-325 MG tablet Take  1-2 tablets by mouth every 4 (four) hours as needed for moderate pain (pain score 4-6).  Marland Kitchen latanoprost (XALATAN) 0.005 % ophthalmic solution Place 1 drop into both eyes at bedtime.  Marland Kitchen levothyroxine (SYNTHROID) 112 MCG tablet Take 112 mcg by mouth every morning.  . menthol-cetylpyridinium (CEPACOL) 3 MG lozenge Take 1 lozenge (3 mg total) by mouth as needed for sore throat (sore throat).  . mirtazapine (REMERON) 15 MG tablet Take 15 mg by mouth at bedtime.  Marland Kitchen NAMENDA XR 28 MG CP24 24 hr capsule Take 28 mg by mouth daily.  Marland Kitchen OLANZapine (ZYPREXA) 2.5 MG tablet Take 1 tablet (2.5 mg  total) by mouth at bedtime as needed (agitation).  . ondansetron (ZOFRAN) 4 MG tablet Take 1 tablet (4 mg total) by mouth every 6 (six) hours as needed for nausea.  . risperiDONE (RISPERDAL) 0.5 MG tablet Take 0.5 mg by mouth daily as needed for anxiety.  . rivastigmine (EXELON) 13.3 MG/24HR Place 13.3 mg onto the skin daily.  Marland Kitchen senna (SENOKOT) 8.6 MG TABS tablet Take 1 tablet (8.6 mg total) by mouth 2 (two) times daily.  . Vitamin D, Ergocalciferol, 50 MCG (2000 UT) CAPS Take 2 capsules by mouth daily.   Marland Kitchen zinc gluconate 50 MG tablet Take 50 mg by mouth daily.   No facility-administered encounter medications on file as of 06/09/2019.      Manal Kreutzer Marlena Clipper, NP

## 2019-06-15 ENCOUNTER — Telehealth: Payer: Self-pay

## 2019-06-15 NOTE — Telephone Encounter (Addendum)
Received message from patient's family per [name] NP wanting to follow up regarding hospice referral.

## 2019-08-08 DEATH — deceased

## 2020-10-16 IMAGING — CR DG HIP (WITH OR WITHOUT PELVIS) 2-3V*R*
3 series · 3 of 3 positions shown · non-contrast
Comparison: 03/01/2015

CLINICAL DATA: 83-year-old female fall in kitchen.

EXAM:
DG HIP (WITH OR WITHOUT PELVIS) 2-3V RIGHT

[x pelvis]
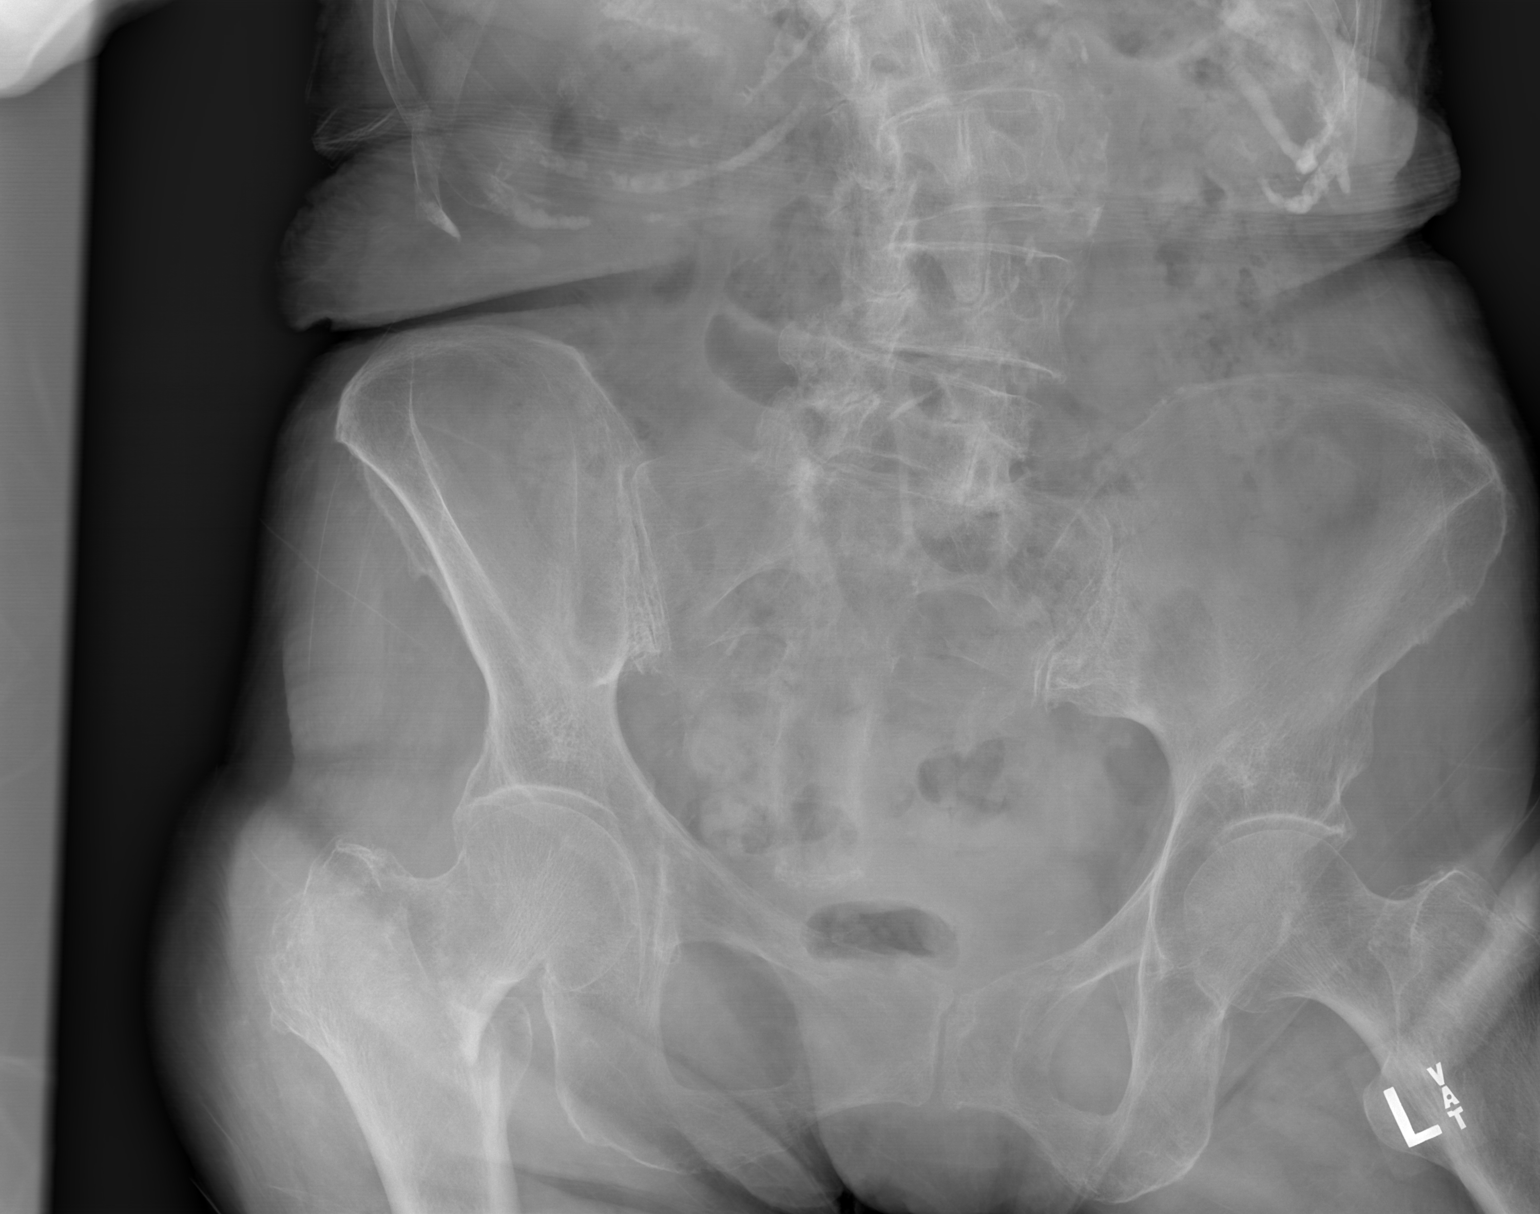

[x hip ap right]
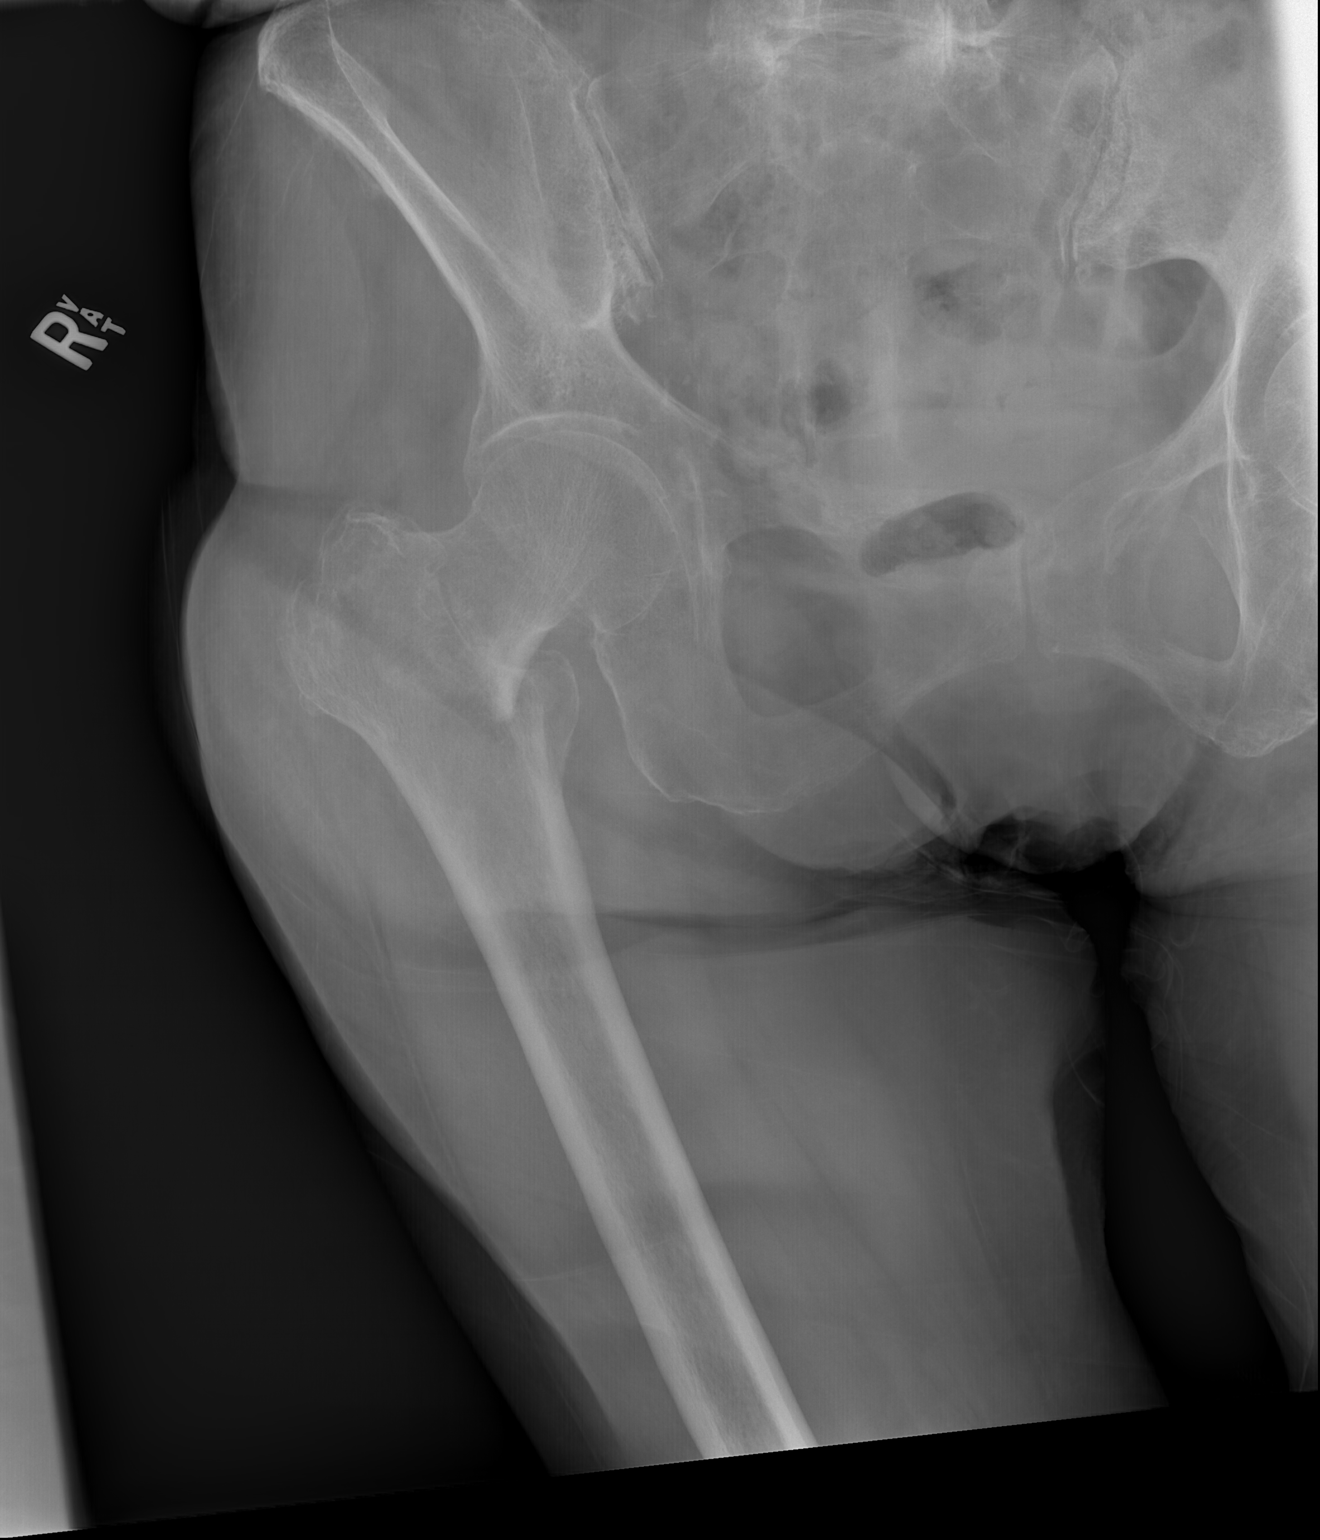

[w hip lat right]
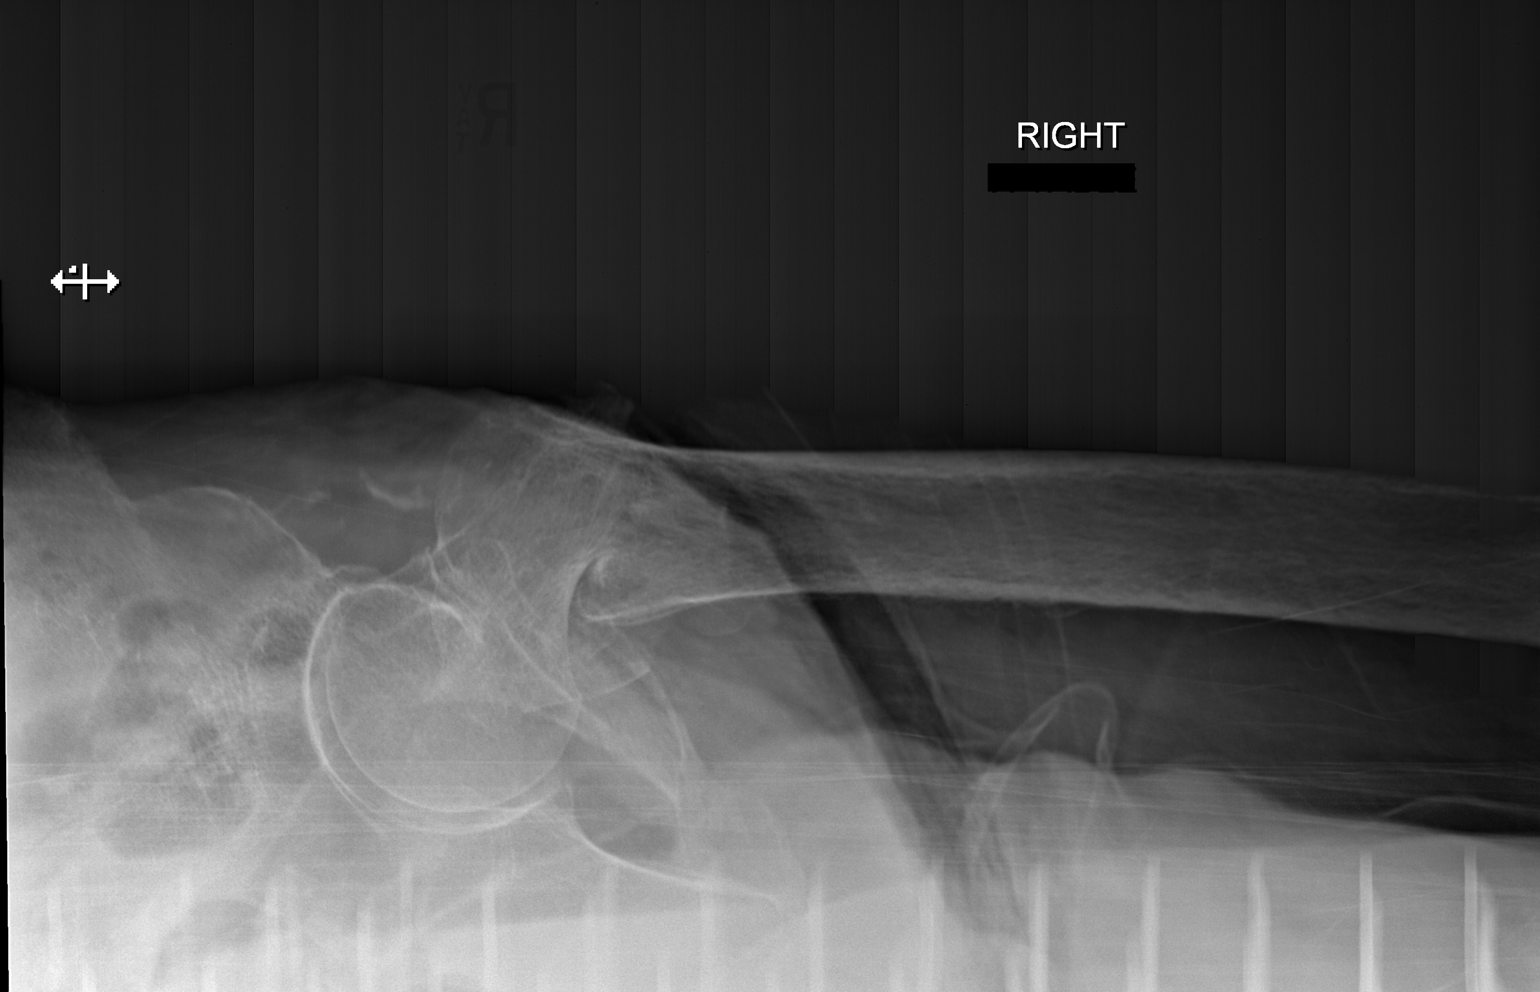

[3 of 3 positions shown; findings below may reference images not displayed]

FINDINGS: Marked scoliosis deformity involving the lumbar spine, convex to the
left. There is an acute, intertrochanteric fracture involving the
proximal right femur. Medial angulation of the distal fracture
fragments noted.
IMPRESSION: 1. Acute intertrochanteric fracture of the proximal right femur.

## 2020-10-16 IMAGING — CR DG CHEST 1V
1 series · 1 of 1 positions shown · non-contrast
Comparison: 11/05/2018

CLINICAL DATA: Fall with hip fracture

EXAM:
CHEST  1 VIEW

[x chest ap]
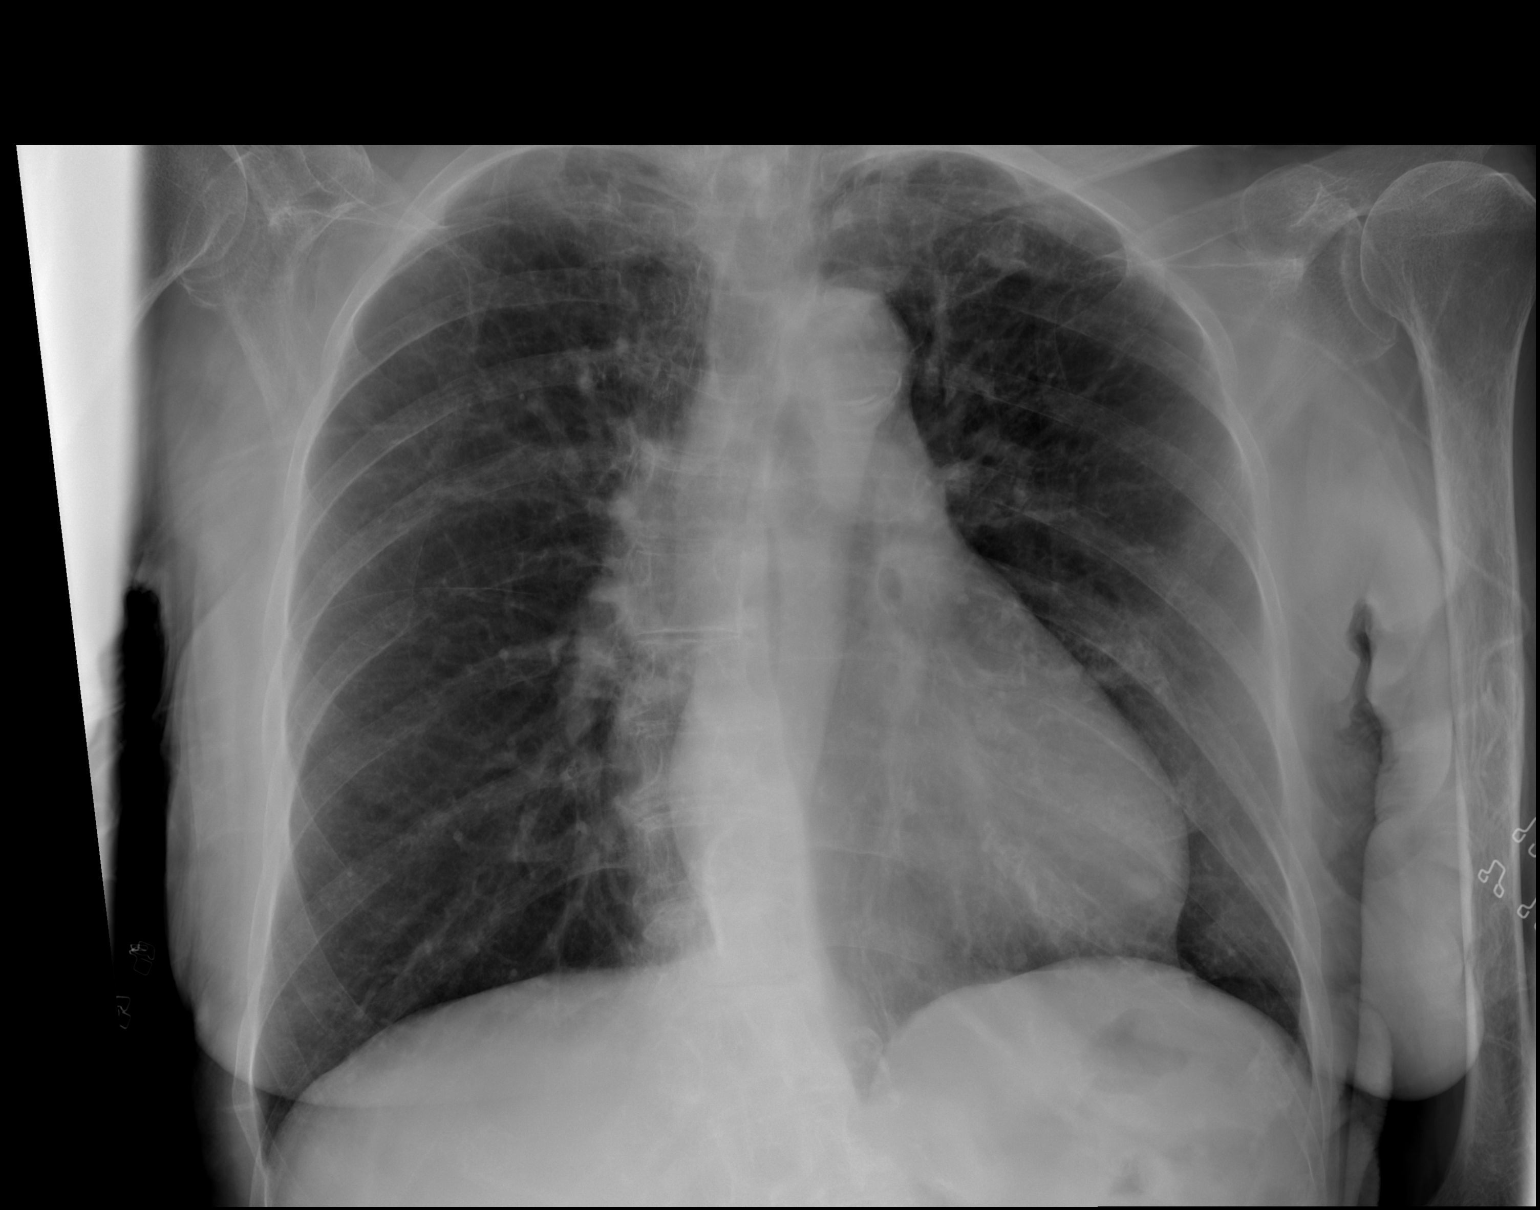

[1 of 1 positions shown; findings below may reference images not displayed]

FINDINGS: No focal opacity or pleural effusion. Stable cardiomediastinal
silhouette with aortic atherosclerosis. No pneumothorax.
IMPRESSION: No active disease.
